# Patient Record
Sex: Female | Born: 1985 | Race: Black or African American | Hispanic: No | Marital: Single | Smoking: Never smoker
Health system: Southern US, Community
[De-identification: ages and names within clinical notes are randomized; demographics above are authoritative.]

## PROBLEM LIST (undated history)

## (undated) ENCOUNTER — Inpatient Hospital Stay (HOSPITAL_COMMUNITY): Payer: Self-pay

## (undated) DIAGNOSIS — K5900 Constipation, unspecified: Secondary | ICD-10-CM

## (undated) DIAGNOSIS — K529 Noninfective gastroenteritis and colitis, unspecified: Secondary | ICD-10-CM

## (undated) DIAGNOSIS — Z789 Other specified health status: Secondary | ICD-10-CM

## (undated) DIAGNOSIS — D649 Anemia, unspecified: Secondary | ICD-10-CM

## (undated) HISTORY — PX: NO PAST SURGERIES: SHX2092

## (undated) HISTORY — PX: DILATION AND CURETTAGE OF UTERUS: SHX78

---
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
## 2008-02-08 ENCOUNTER — Emergency Department (HOSPITAL_BASED_OUTPATIENT_CLINIC_OR_DEPARTMENT_OTHER): Admission: EM | Admit: 2008-02-08 | Discharge: 2008-02-08 | Payer: Self-pay | Admitting: Emergency Medicine

## 2008-04-05 ENCOUNTER — Inpatient Hospital Stay (HOSPITAL_COMMUNITY): Admission: AD | Admit: 2008-04-05 | Discharge: 2008-04-05 | Payer: Self-pay | Admitting: Obstetrics and Gynecology

## 2008-04-05 IMAGING — US US OB COMP LESS 14 WK
1 series · 14 of 28 positions shown · non-contrast
Comparison: None

CLINICAL DATA: Pregnancy Bleeding; ; QUANTITATIVE BETA HCG [DATE].

OBSTETRIC <14 WK US AND TRANSVAGINAL OB US
TECHNIQUE: Both transabdominal and transvaginal ultrasound
examinations were performed for complete evaluation of the
gestation as well as the maternal uterus, adnexal regions, and
pelvic cul-de-sac.

[Series 1: us ob comp less 14 wk · 0.24mm/px · 14 of 40 slices shown]
[im 2/40]
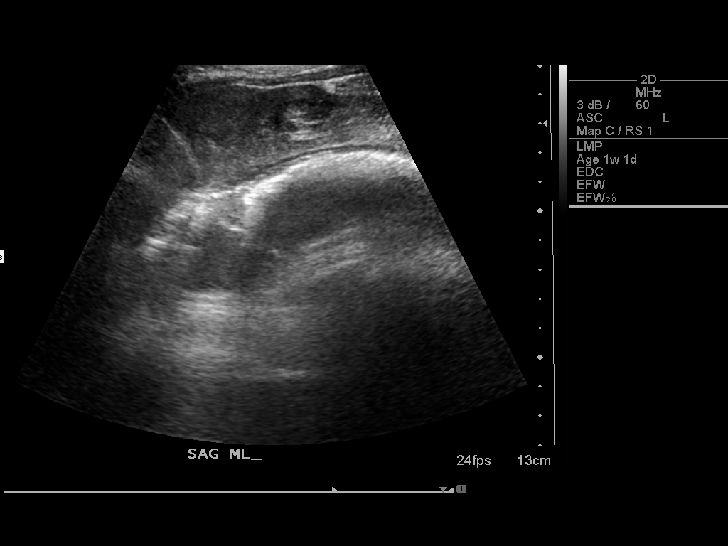
[im 5/40]
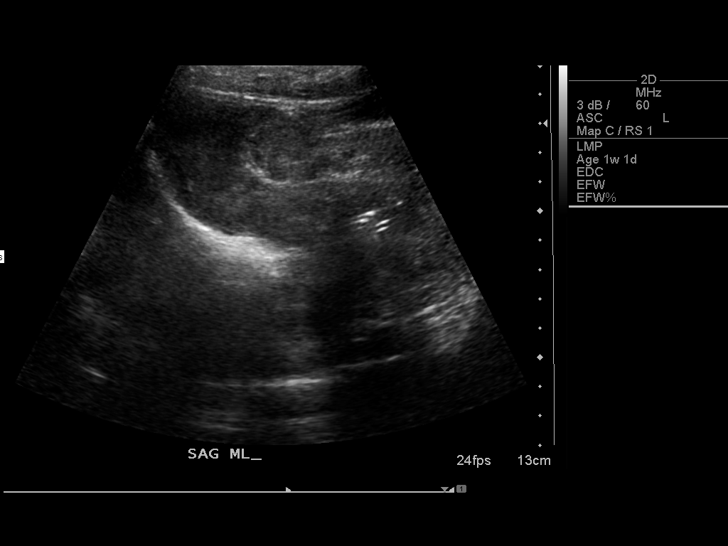
[im 8/40]
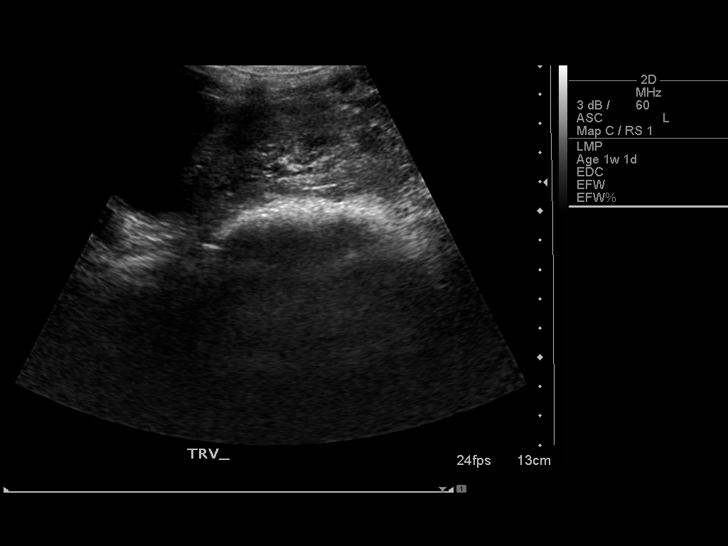
[im 11/40]
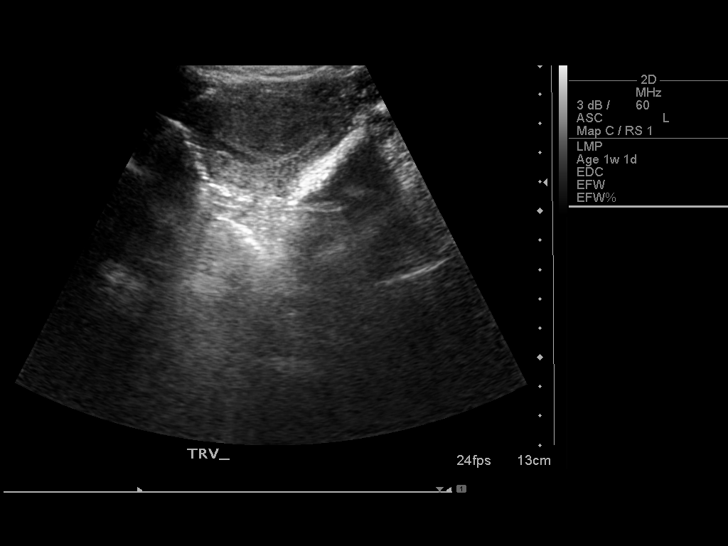
[im 14/40]
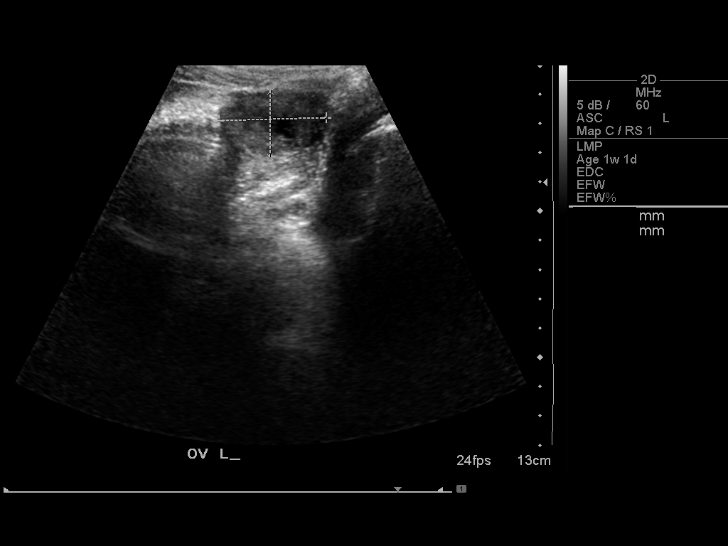
[im 16/40]
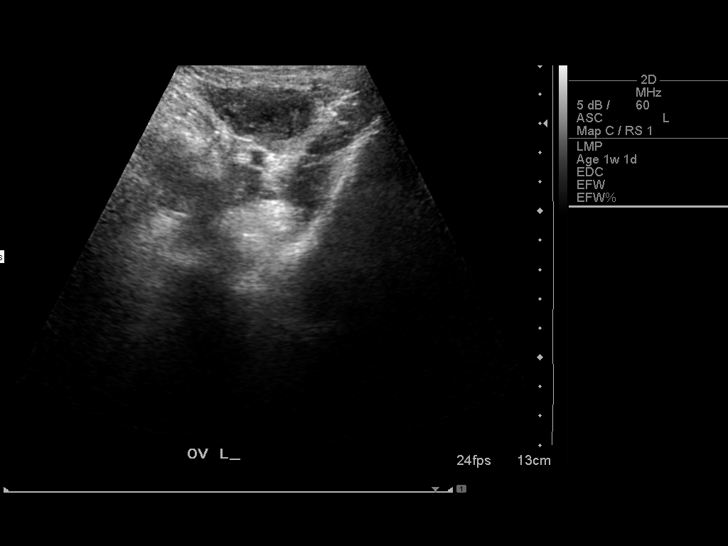
[im 19/40]
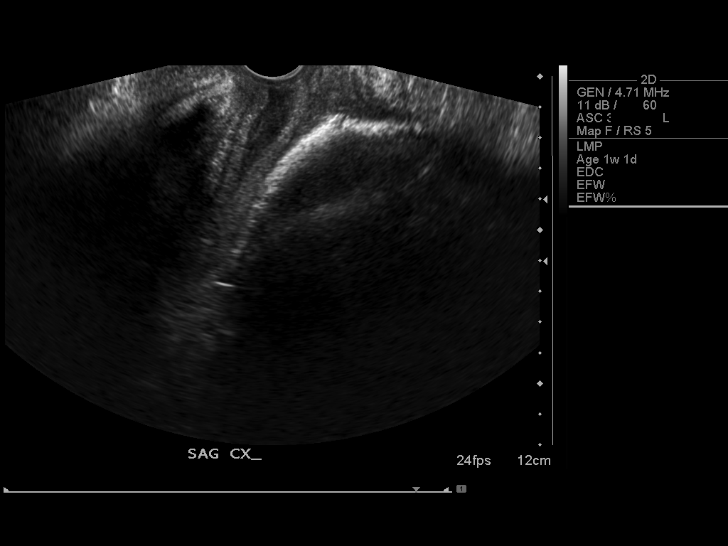
[im 22/40]
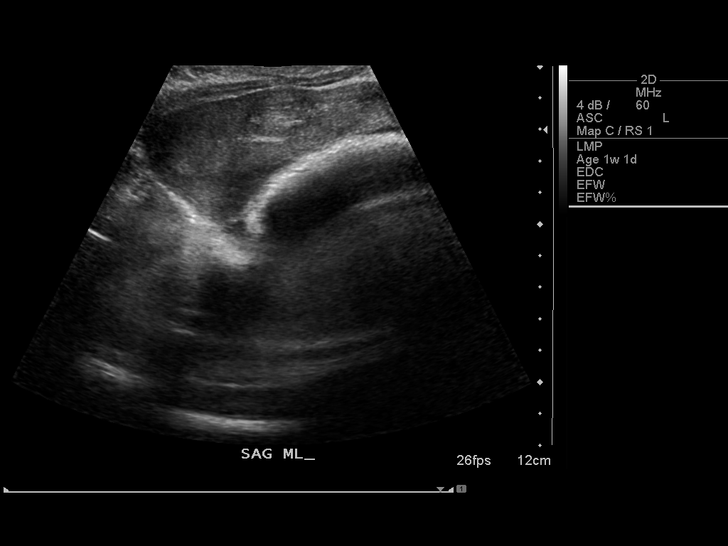
[im 25/40]
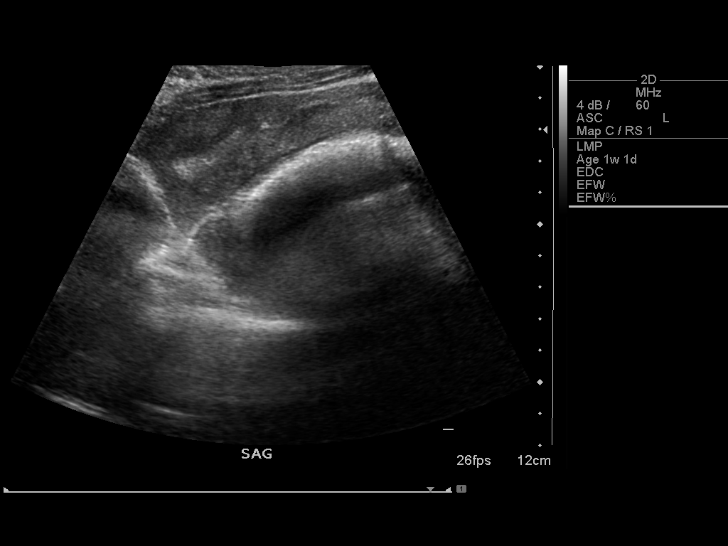
[im 28/40]
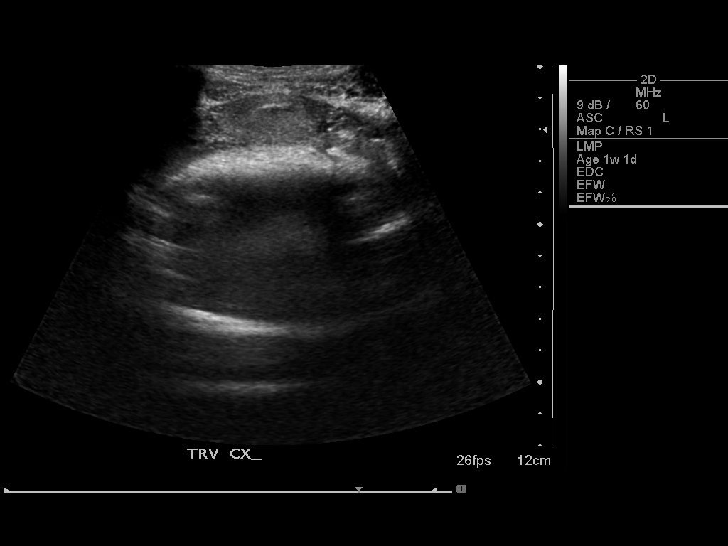
[im 31/40]
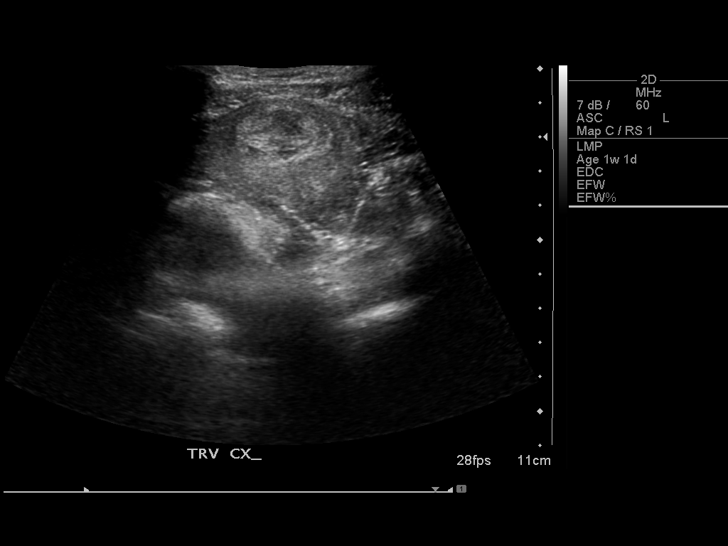
[im 34/40]
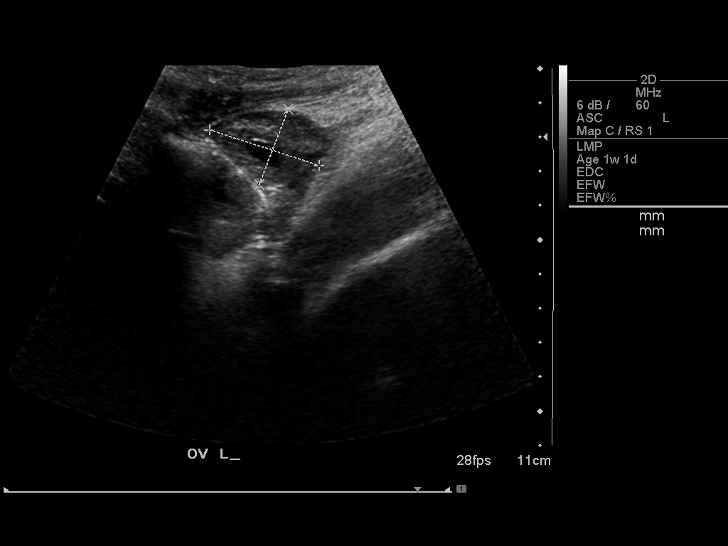
[im 37/40]
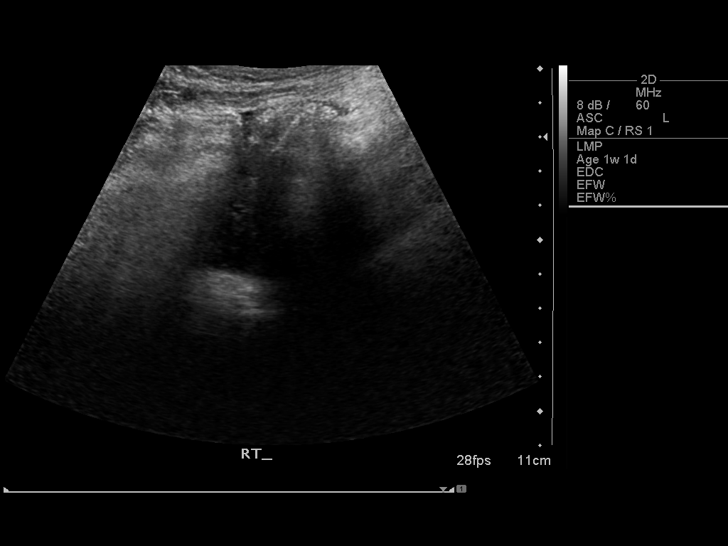
[im 40/40]
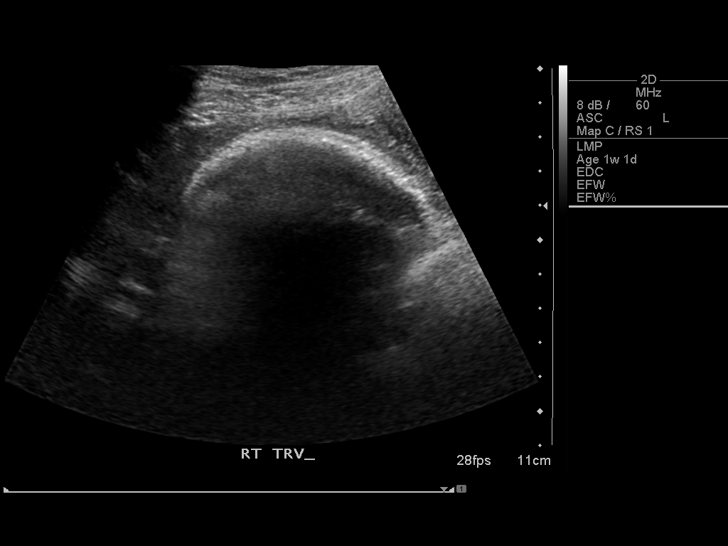

[14 of 28 positions shown; findings below may reference images not displayed]

FINDINGS: No intrauterine gestational sac identified.  There is a
rounded heterogeneous structure noted anteriorly within the uterus,
measuring 2.5 x 2.2 x 1.7 cm.

No free fluid in the pelvis.  Left ovary unremarkable.  Visualized
right adnexa unremarkable.  There is a large amount of stool and
gas which obscures the right adnexa partially.
IMPRESSION: No intrauterine gestation.  Given a quantitative beta HCG of
[DATE], this could be related to spontaneous abortion.  Cannot
completely exclude ectopic although this is much less likely given
the lack of right free fluid or adnexal mass.  Recommend
correlation with quantitative beta HCG levels.

Heterogeneous rounded structure in the anterior uterus, which could
represent submucosal fibroid or retained products of conception.

## 2008-04-11 ENCOUNTER — Inpatient Hospital Stay (HOSPITAL_COMMUNITY): Admission: AD | Admit: 2008-04-11 | Discharge: 2008-04-11 | Payer: Self-pay | Admitting: Obstetrics and Gynecology

## 2008-05-07 ENCOUNTER — Emergency Department (HOSPITAL_BASED_OUTPATIENT_CLINIC_OR_DEPARTMENT_OTHER): Admission: EM | Admit: 2008-05-07 | Discharge: 2008-05-07 | Payer: Self-pay | Admitting: Emergency Medicine

## 2008-07-10 ENCOUNTER — Emergency Department (HOSPITAL_BASED_OUTPATIENT_CLINIC_OR_DEPARTMENT_OTHER): Admission: EM | Admit: 2008-07-10 | Discharge: 2008-07-10 | Payer: Self-pay | Admitting: Emergency Medicine

## 2009-03-18 ENCOUNTER — Emergency Department (HOSPITAL_BASED_OUTPATIENT_CLINIC_OR_DEPARTMENT_OTHER): Admission: EM | Admit: 2009-03-18 | Discharge: 2009-03-18 | Payer: Self-pay | Admitting: Emergency Medicine

## 2009-04-10 ENCOUNTER — Emergency Department (HOSPITAL_BASED_OUTPATIENT_CLINIC_OR_DEPARTMENT_OTHER): Admission: EM | Admit: 2009-04-10 | Discharge: 2009-04-10 | Payer: Self-pay | Admitting: Emergency Medicine

## 2009-04-11 ENCOUNTER — Emergency Department (HOSPITAL_BASED_OUTPATIENT_CLINIC_OR_DEPARTMENT_OTHER): Admission: EM | Admit: 2009-04-11 | Discharge: 2009-04-11 | Payer: Self-pay | Admitting: Emergency Medicine

## 2010-11-14 LAB — WOUND CULTURE

## 2010-11-15 LAB — BASIC METABOLIC PANEL
BUN: 11 mg/dL (ref 6–23)
Calcium: 10 mg/dL (ref 8.4–10.5)
Chloride: 106 mEq/L (ref 96–112)
Creatinine, Ser: 0.8 mg/dL (ref 0.4–1.2)
GFR calc Af Amer: >60 mL/min (ref >60)
Potassium: 4.6 mEq/L (ref 3.5–5.1)

## 2010-11-15 LAB — URINALYSIS, ROUTINE W REFLEX MICROSCOPIC
Glucose, UA: NEGATIVE mg/dL
Nitrite: POSITIVE — AB
pH: 5.5 (ref 5.0–8.0)

## 2010-11-15 LAB — URINE MICROSCOPIC-ADD ON

## 2010-11-15 LAB — PREGNANCY, URINE: Preg Test, Ur: NEGATIVE

## 2010-12-26 NOTE — Op Note (Signed)
Maria Harvey, Maria Harvey            ACCOUNT NO.:  [id number]   MEDICAL RECORD NO.:  [id number]          PATIENT TYPE:  AMB   LOCATION:  SDC                           FACILITY:  [facility]   PHYSICIAN:  Gerrit Friends. Aldona Bar, M.D.   DATE OF BIRTH:  07-07-1986   DATE OF PROCEDURE:  12/30/2005  DATE OF DISCHARGE:                                 OPERATIVE REPORT   PREOPERATIVE DIAGNOSES:  First trimester pregnancy loss - missed abortion -  blood type O+.   POSTOPERATIVE DIAGNOSES:  First trimester pregnancy loss - missed abortion -  blood type O+.  Pathology pending.   PROCEDURE:  Examination under anesthesia, suction dilatation and curettage  and ultrasound.   HISTORY:  This 25 year old gravida 3, para 0 (2 previous elective  terminations) presented to the office several weeks ago for evaluation as a  newly pregnant patient.  It was discovered that her size did not match her  dates, and indeed, the diagnosis of a missed AB was eventually made.  The  patient's anatomy is quite unusual - it appears as if her rectum is filled  with a large amount of stool, and her cervix is very deep and to the left  and the uterus appears to be posterior.  With much difficulty, she was  examined in the office.   The patient was taken to the operating room, and after satisfactory  induction of intravenous conscious sedation, she was prepped and draped in  the usual fashion, having been placed in the modified lithotomy position in  the short Allen stirrups.  The bladder was drained of clear urine with red  rubber catheter in an in-and-out fashion, and thereafter, after the patient  was prepped and draped, the patient was examined.  Again, it appeared that  the posterior vagina was pushed anteriorly by what appeared to be a large  amount of stool in the rectum.  The cervix was very difficult to feel and  was very deep in the vagina, slightly deviated to the left side of the apex  of vagina.  It was not possible to  feel the uterus.  On previous ultrasound,  it appeared that the uterus did tend to go posterior and to the left.   At this time, a low Grave's speculum was put in upside down, so that  visualization of the cervix could be carried out.  The patient was well  sedated with intravenous conscious sedation, and so, it was possible to put  a single-tooth tenaculum on the cervix and dilate the cervix to a #25 Pratt  dilator without difficulty.  Thereafter, using a #8 curved suction curette,  what was felt to be the cavity was entered and evacuated appropriately.  Because of the depth to which a curette would have to be placed, curettage  was not done.  The cavity was thoroughly, gently and hopefully effectively  evacuated of all products of conception.  At this time, the ultrasound  machine was obtained from maternity admissions, and using the vaginal probe  and the abdominal probe, a uterus with retained products was not visualized.   In  again, examination was carried out, and this time, a rectovaginal exam  was done.  Again, it was noted the rectum was markedly dilated by a large  amount of stool, and this was the mass that was felt posterior to the  vagina.  The cervix was very deep and at the left of the midline at the apex  of the vagina, and nothing consistent with the uterus was really felt  thereafter.  Bleeding was minimal.  Examination of what was removed was  consistent with products of conception, and it was assumed at this time that  the procedure was complete.  The patient was not bleeding, and at this  point, the procedure was terminated, and the patient was sent to the  recovery room in satisfactory condition having tolerated the procedure well.   The patient will be discharged to home with a prescription for doxycycline  to use 100 mg twice daily for 5 days and Anaprox DS 1 every 8 hours as  needed for cramping and pain.  She will be seen in the office in  approximately two  weeks' time or as needed.  It may be necessary at some  point in the near future to have this patient evaluated to actually  determine her lower GI tract and pelvic anatomy.  Condition on arrival in  the recovery room satisfactory.      Gerrit Friends. Aldona Bar, M.D.  Electronically Signed     RMW/MEDQ  D:  12/30/2005  T:  12/30/2005  Job:  [id number]

## 2010-12-26 NOTE — Discharge Summary (Signed)
Maria Harvey, Maria Harvey            ACCOUNT NO.:  [id number]   MEDICAL RECORD NO.:  [id number]          PATIENT TYPE:  INP   LOCATION:  9306                          FACILITY:  [facility]   PHYSICIAN:  Randye Lobo, M.D.   DATE OF BIRTH:  [birth year]   DATE OF ADMISSION:  01/07/2006  DATE OF DISCHARGE:  01/11/2006                                 DISCHARGE SUMMARY   ADMISSION DIAGNOSES:  1.  Status post dilation and evacuation on Dec 30, 2005, for missed      abortion.  2.  Colitis.   DISCHARGE DIAGNOSES:  1.  Status post dilation and evacuation for missed abortion on Dec 30, 2005.  2.  Colitis, resolving.  3.  Possible low-grade endometritis/pelvic inflammatory disease.   ADMISSION HISTORY AND PHYSICAL EXAMINATION:  The patient is a 25 year old,  gravida 3, para 0-0-3-0, African American female, status post dilation and  evacuation for missed abortion on Dec 30, 2005, who presented to the [facility] on January 08, 2006, with lower abdominal pain localizing to the right  lower quadrant along with nausea, vomiting, diarrhea, and low-grade fever.  The patient had her T&E performed on Dec 30, 2005, which was technically  challenging due to a dilated rectum filled with firm stool. The dilation and  evacuation was performed with ultrasound guidance and there were no  complications. The pathology report documented tissue from the endometrial  which was decidua which chorionic villi and implantation site consistent  with intrauterine pregnancy. The patient was having some mild vaginal  bleeding. The patient was placed on doxycycline antibiotics postoperatively  for infection prophylaxis.   The patient's admission physical exam was consistent with a temperature of  98.8 degrees Fahrenheit, pulse 97, and blood pressure 129/82.  The abdomen  was generally tender with evidence of guarding. On pelvic exam there was no  evidence of any pus coming from the cervical os. An aerobic culture was  taken.  Gonorrhea and chlamydia cultures were also obtained. There was  evidence of cervical motion tenderness and no masses were appreciated. There  was tenderness in both the right and left lower quadrant.   The patient's admission documented a white count of 28,000 with 88%  neutrophils, 5% lymphocytes, 8% monocytes, and 0% eosinophils and basophils.  The patient's hemoglobin was 13.6. Her complete metabolic profile was  unremarkable except for a glucose level of 154.   The patient was given a working diagnosis of possible pelvic inflammatory  disease and C. difficile colitis.  The patient was started on IV  ciprofloxacin and IV doxycycline in addition to Flagyl. A CT scan of the  abdomen and pelvis was performed documenting markedly edematous and thick  walled rectus sigmoid colon consistent with colitis or severe inflammatory  bowel disease. There was no evidence of free fluid or free air or adenopathy  of the abdomen. The liver, spleen, pancreas, adrenals, kidneys, uterus, and  adnexa were unremarkable.   The patient was treated with IV fluids and bowel rest along with her  antibiotics. The patient remained afebrile throughout her hospital stay and  had normal  vital signs. Her abdominal exam became normal by the time of her  discharge and she was able to tolerate a regular diet. The patient did note  a little bit of possible rectal bleeding with a solid bowel movement on the  day prior to her discharge. It was unclear if this was related to her  vaginal bleeding following her dilation and evacuation.   The patient's uterine culture returned demonstrating moderate gram-positive  rods with ultimately multiple organisms and none predominant. Her stool  culture demonstrated normal flora. There was no evidence of Salmonella,  Shigella, Campylobacter, or Yersinia. Her Clostridium difficile culture was  negative. The patient's CBC on June 3rd documented a white blood cell count  of 10.4  and her hemoglobin was 9.5.   The patient was found to be in good condition and ready for discharge on  hospital day #4.   INSTRUCTIONS AT DISCHARGE:  1.  Discharge to home.  2.  The patient will take ciprofloxacin 500 mg p.o. b.i.d. for the following      seven days, along with doxycycline 100 mg p.o. b.i.d. times seven days.  3.  The patient will follow a regular diet.  4.  The patient will follow up with Dr. Aldona Bar in the office for a      postoperative check in about 7-10 days.  5.  The patient will call if she experiences any problems with fever,      increased abdominal pain, recurrent diarrhea, any persistent rectal      bleeding, or any other concerns.  6.  Consideration will be given for a repeat CT scan of the abdomen and      pelvis as well as referral to a gastroenterologist for evaluation of      colitis as an outpatient.      Randye Lobo, M.D.  Electronically Signed     BES/MEDQ  D:  01/11/2006  T:  01/11/2006  Job:  [id number]   cc:   Gerrit Friends. Aldona Bar, M.D.  Fax: [phone]

## 2011-05-11 LAB — URINALYSIS, ROUTINE W REFLEX MICROSCOPIC
Hgb urine dipstick: NEGATIVE
Ketones, ur: NEGATIVE
pH: 6

## 2011-05-11 LAB — URINE MICROSCOPIC-ADD ON

## 2011-05-15 LAB — URINE MICROSCOPIC-ADD ON

## 2011-05-15 LAB — URINALYSIS, ROUTINE W REFLEX MICROSCOPIC
Hgb urine dipstick: NEGATIVE
Ketones, ur: 15 mg/dL — AB
Leukocytes, UA: NEGATIVE
Specific Gravity, Urine: 1.029 (ref 1.005–1.030)

## 2011-05-15 LAB — GC/CHLAMYDIA PROBE AMP, GENITAL: Chlamydia, DNA Probe: NEGATIVE

## 2011-05-15 LAB — PREGNANCY, URINE: Preg Test, Ur: NEGATIVE

## 2011-05-15 LAB — WET PREP, GENITAL: Trich, Wet Prep: NONE SEEN

## 2014-06-03 ENCOUNTER — Encounter (HOSPITAL_BASED_OUTPATIENT_CLINIC_OR_DEPARTMENT_OTHER): Payer: Self-pay | Admitting: Emergency Medicine

## 2014-06-03 ENCOUNTER — Emergency Department (HOSPITAL_BASED_OUTPATIENT_CLINIC_OR_DEPARTMENT_OTHER)
Admission: EM | Admit: 2014-06-03 | Discharge: 2014-06-04 | Disposition: A | Discharge status: Home / Self Care | Payer: Self-pay | Attending: Emergency Medicine | Admitting: Emergency Medicine

## 2014-06-03 DIAGNOSIS — O23511 Infections of cervix in pregnancy, first trimester: Secondary | ICD-10-CM | POA: Insufficient documentation

## 2014-06-03 DIAGNOSIS — Z3A00 Weeks of gestation of pregnancy not specified: Secondary | ICD-10-CM | POA: Insufficient documentation

## 2014-06-03 DIAGNOSIS — Z3490 Encounter for supervision of normal pregnancy, unspecified, unspecified trimester: Secondary | ICD-10-CM

## 2014-06-03 DIAGNOSIS — N390 Urinary tract infection, site not specified: Secondary | ICD-10-CM

## 2014-06-03 DIAGNOSIS — A599 Trichomoniasis, unspecified: Secondary | ICD-10-CM

## 2014-06-03 DIAGNOSIS — N72 Inflammatory disease of cervix uteri: Secondary | ICD-10-CM

## 2014-06-03 DIAGNOSIS — A5901 Trichomonal vulvovaginitis: Secondary | ICD-10-CM | POA: Insufficient documentation

## 2014-06-03 DIAGNOSIS — O2341 Unspecified infection of urinary tract in pregnancy, first trimester: Secondary | ICD-10-CM | POA: Insufficient documentation

## 2014-06-03 LAB — URINALYSIS, ROUTINE W REFLEX MICROSCOPIC
Glucose, UA: NEGATIVE mg/dL
Hgb urine dipstick: NEGATIVE
Ketones, ur: 15 mg/dL — AB
Nitrite: POSITIVE — AB
PH: 5.5 (ref 5.0–8.0)
PROTEIN: NEGATIVE mg/dL
Specific Gravity, Urine: 1.035 — ABNORMAL HIGH (ref 1.005–1.030)
UROBILINOGEN UA: 1 mg/dL (ref 0.0–1.0)

## 2014-06-03 LAB — URINE MICROSCOPIC-ADD ON

## 2014-06-03 LAB — WET PREP, GENITAL: YEAST WET PREP: NONE SEEN

## 2014-06-03 LAB — PREGNANCY, URINE: Preg Test, Ur: POSITIVE — AB

## 2014-06-03 NOTE — ED Notes (Signed)
Pt reports n/v/d since she woke up this am.  Reports 3 episodes of vomiting and 4 episodes of diarrhea.

## 2014-06-03 NOTE — ED Provider Notes (Signed)
CSN: [id number]     Arrival date & time 06/03/14  2127 History  This chart was scribed for [name], MD by Roxy Cedarhandni Bhalodia, ED Scribe. This patient was seen in room MH10/MH10 and the patient's care was started at 11:19 PM.   Chief Complaint  Patient presents with  . Emesis   Patient is a 28 y.o. female presenting with vomiting. The history is provided by the patient. No language interpreter was used.  Emesis Severity:  Moderate Duration:  1 day Timing:  Intermittent Number of daily episodes:  3 Quality:  Stomach contents Progression:  Unchanged Chronicity:  New Relieved by:  Nothing Worsened by:  Nothing tried Ineffective treatments:  None tried Associated symptoms: no fever   Risk factors: no sick contacts    HPI Comments: Maria Harvey is a 28 y.o. female who presents to the Emergency Department complaining of emesis that began this morning.  History reviewed. No pertinent past medical history. History reviewed. No pertinent past surgical history. No family history on file. History  Substance Use Topics  . Smoking status: Never Smoker   . Smokeless tobacco: Not on file  . Alcohol Use: No   OB History   Grav Para Term Preterm Abortions TAB SAB Ect Mult Living                 Review of Systems  Gastrointestinal: Positive for vomiting.  All other systems reviewed and are negative.  Allergies  Review of patient's allergies indicates no known allergies.  Home Medications   Prior to Admission medications   Not on File   Triage Vitals: BP 122/76  Pulse 84  Temp(Src) 98.3 F (36.8 C) (Oral)  Resp 16  Ht 5\' 11"  (1.803 m)  Wt 153 lb (69.4 kg)  BMI 21.35 kg/m2  SpO2 100%  Physical Exam  Nursing note and vitals reviewed. Constitutional: She is oriented to person, place, and time. She appears well-developed and well-nourished.  HENT:  Head: Normocephalic and atraumatic.  Right Ear: External ear normal.  Left Ear: External ear normal.   Mouth/Throat: No oropharyngeal exudate.  Eyes: Conjunctivae are normal. Pupils are equal, round, and reactive to light. Right eye exhibits no discharge. Left eye exhibits no discharge.  Neck: Normal range of motion. No tracheal deviation present.  Cardiovascular: Normal rate, regular rhythm and normal heart sounds.   Pulmonary/Chest: Effort normal and breath sounds normal. No respiratory distress.  Abdominal: Soft. Bowel sounds are normal. She exhibits no distension and no mass. There is no tenderness. There is no rebound and no guarding.  Genitourinary: Vaginal discharge found.  Chaperone present  Musculoskeletal: Normal range of motion. She exhibits no edema and no tenderness.  Neurological: She is alert and oriented to person, place, and time. Coordination normal.  Skin: Skin is warm and dry. No rash noted. She is not diaphoretic. No erythema.  Psychiatric: She has a normal mood and affect.   ED Course  Procedures (including critical care time)  DIAGNOSTIC STUDIES: Oxygen Saturation is 100% on RA, normal by my interpretation.    COORDINATION OF CARE: 11:25 PM- Discussed positive pregnancy test results. Told patient to avoid sexual activity for the next 7 days. Pt advised of plan for treatment and pt agrees.  Labs Review Labs Reviewed  URINALYSIS, ROUTINE W REFLEX MICROSCOPIC - Abnormal; Notable for the following:    APPearance TURBID (*)    Specific Gravity, Urine 1.035 (*)    Bilirubin Urine SMALL (*)    Ketones, ur  15 (*)    Nitrite POSITIVE (*)    Leukocytes, UA LARGE (*)    All other components within normal limits  PREGNANCY, URINE - Abnormal; Notable for the following:    Preg Test, Ur POSITIVE (*)    All other components within normal limits  URINE MICROSCOPIC-ADD ON - Abnormal; Notable for the following:    Squamous Epithelial / LPF MANY (*)    Bacteria, UA MANY (*)    All other components within normal limits   Imaging Review No results found.   EKG  Interpretation None     MDM   Final diagnoses:  None    1. Pregnancy 2. uti 3. Trichomoniasis Medications  metroNIDAZOLE (FLAGYL) tablet 2,000 mg (2,000 mg Oral Given 06/04/14 0026)  cefTRIAXone (ROCEPHIN) injection 250 mg (250 mg Intramuscular Given 06/04/14 0026)  azithromycin (ZITHROMAX) powder 1 g (1 g Oral Given 06/04/14 0026)   Treated for STIs.  No sexual activity until 7 days after all partners treated.  Follow up with gynecology for pregnancy  I personally performed the services described in this documentation, which was scribed in my presence. The recorded information has been reviewed and is accurate.  Vyncent Overby Smitty CordsK Dusty Wagoner-Rasch, MD 06/04/14 0430

## 2014-06-04 ENCOUNTER — Encounter (HOSPITAL_BASED_OUTPATIENT_CLINIC_OR_DEPARTMENT_OTHER): Payer: Self-pay | Admitting: Emergency Medicine

## 2014-06-04 LAB — GC/CHLAMYDIA PROBE AMP
CT PROBE, AMP APTIMA: POSITIVE — AB
GC Probe RNA: NEGATIVE

## 2014-06-04 MED ORDER — CEFTRIAXONE SODIUM 250 MG IJ SOLR
250.0000 mg | Freq: Once | INTRAMUSCULAR | Status: AC
  Administered 2014-06-04: 250 mg via INTRAMUSCULAR
  Filled 2014-06-04: qty 250

## 2014-06-04 MED ORDER — NITROFURANTOIN MONOHYD MACRO 100 MG PO CAPS: 100.0000 mg | ORAL_CAPSULE | Freq: Two times a day (BID) | ORAL | Status: DC

## 2014-06-04 MED ORDER — METRONIDAZOLE 500 MG PO TABS
2000.0000 mg | ORAL_TABLET | Freq: Once | ORAL | Status: AC
  Administered 2014-06-04: 2000 mg via ORAL
  Filled 2014-06-04: qty 4

## 2014-06-04 MED ORDER — AZITHROMYCIN 1 G PO PACK
1.0000 g | PACK | Freq: Once | ORAL | Status: AC
  Administered 2014-06-04: 1 g via ORAL
  Filled 2014-06-04: qty 1

## 2014-06-04 NOTE — Discharge Instructions (Signed)
°Emergency Department Resource Guide °1) Find a Doctor and Pay Out of Pocket °Although you won't have to find out who is covered by your insurance plan, it is a good idea to ask around and get recommendations. You will then need to call the office and see if the doctor you have chosen will accept you as a new patient and what types of options they offer for patients who are self-pay. Some doctors offer discounts or will set up payment plans for their patients who do not have insurance, but you will need to ask so you aren't surprised when you get to your appointment. ° °2) Contact Your Local Health Department °Not all health departments have doctors that can see patients for sick visits, but many do, so it is worth a call to see if yours does. If you don't know where your local health department is, you can check in your phone book. The CDC also has a tool to help you locate your state's health department, and many state websites also have listings of all of their local health departments. ° °3) Find a Walk-in Clinic °If your illness is not likely to be very severe or complicated, you may want to try a walk in clinic. These are popping up all over the country in pharmacies, drugstores, and shopping centers. They're usually staffed by nurse practitioners or physician assistants that have been trained to treat common illnesses and complaints. They're usually fairly quick and inexpensive. However, if you have serious medical issues or chronic medical problems, these are probably not your best option. ° °No Primary Care Doctor: °- Call [facility] at  832-8000 - they can help you locate a primary care doctor that  accepts your insurance, provides certain services, etc. °- Physician Referral Service- 1-800-533-3463 ° °Chronic Pain Problems: °Organization         Address  Phone   Notes  °[facility]  (336) 297-2271 Patients need to be referred by their primary care doctor.  ° °Medication  Assistance: °Organization         Address  Phone   Notes  °[place] [facility] [address] °[place], [place] [address] (336) 641-8030 --Must be a resident of [place] °-- Must have NO insurance coverage whatsoever (no Medicaid/ Medicare, etc.) °-- The pt. MUST have a primary care doctor that directs their care regularly and follows them in the community °  °[facility]  (866) 331-1348   °[employer]  (888) 892-1162   ° °Agencies that provide inexpensive medical care: °Organization         Address  Phone   Notes  °[facility]  (336) 832-8035   °[facility]    (336) 832-7272   °[facility] [address] °[place], [place] [address] (336) 832-4777   °[facility] [address], °[place] (336) 271-4999   °[facility]    (336) 373-0678   °[facility]    (336) 272-1050   °[facility] ° [address], [place] Phone:  (336) 832-4444, Fax:  (336) 832-4440 Hours of Operation:  9 am - 6 pm, M-F.  Also accepts Medicaid/Medicare and self-pay.  °[facility] ° [address], [place] Phone: (336) 832-3150, Fax: (336) [phone]. Hours of Operation:  8:30 am - 5:30 pm, M-F.  Also accepts Medicaid and self-pay.  °[facility] [place] [address], [place] Phone: (336) 878-6027   °[facility] [address], [place], [place] (336)723-1848, Ext. 123 Mondays & Thursdays: 7-9 AM.  First 15 patients are seen on a first come, first serve basis. °  ° °Medicaid-accepting [place] Providers: ° °Organization         Address  Phone   Notes  °[facility] [address], [place] (336) 641-2100 Also accepts self-pay patients.  °[facility] [address], [place] ° (336) 856-9996   °[facility] [address], [place]  (336) 288-8857   °[facility] [address] [place] [address], [place] (336) 299-7000   °Veita Bland [address], [place]  ° (336) 373-1557 Only accepts [place] Access Medicaid patients after they have their name applied to their card.  ° °Self-Pay (no insurance) in [place]: ° °Organization         Address  Phone   Notes  °Sickle Cell Patients, [facility] [address], [place] (336) 832-1970   °[facility] [address], [place] (336) 832-4400   °[facility] ° [address], [place] (336) 992-4800   °[facility]/Dr. Osei-Bonsu ° [address] [place] [address], [place] or [address], [place] (336) [phone] Phone number for both [place] and [place] locations is the same.  °[facility] [address], [place] (336) 299-0000   °[facility] [address] [place] [address], [place] or [address] (336) 852-7530 °(336) 878-2260   °[facility] [address], [place] (336) 350-1642, phone; (336) 294-5005, fax Sees patients 1st and 3rd Saturday of every month.  Must not qualify for public or private insurance (i.e. Medicaid, Medicare, [place] Health Choice, Veterans' Benefits) • Household income should be no more than 200% of the poverty level •The clinic cannot treat you if you are pregnant or think you are pregnant • Sexually transmitted diseases are not treated at the clinic.  ° ° °Dental Care: °Organization         Address  Phone  Notes  °[place] [facility] [address], [place] (336) 641-6152 Accepts children up to age 21 who are enrolled in Medicaid or [place] Health Choice; pregnant women with a Medicaid card; and children who have applied for Medicaid or [place] Health Choice, but were declined, whose parents can pay a reduced fee at time of service.  °[place]  [facility] [place]  [address], [place] (336) 641-7733 Accepts children up to age 21 who are enrolled in Medicaid or [place] Health Choice; pregnant women with a Medicaid card; and children who have applied for Medicaid or [place] Health Choice, but were declined, whose parents can pay a reduced fee at time of service.  °[facility] ° [address], [place] (336) 641-4533 Patients are seen by appointment only. Walk-ins are not accepted. [facility] will see patients 18 years of age and older. °Monday - Tuesday (8am-5pm) °Most Wednesdays (8:30-5pm) °$30 per visit, cash only  °[facility] ° [address], [place] (336) 641-4533 Patients are seen by appointment only. Walk-ins are not accepted. [facility] will see patients 18 years of age and older. °One   Wednesday Evening (Monthly: Volunteer Based).  $30 per visit, cash only  °[facility]  (919) 537-3737 for adults; Children under age 4, call Graduate Pediatric Dentistry at ([phone]) [phone]. Children aged 4-14, please call (919) [phone] to request a pediatric application. ° Dental services are provided in all areas of dental care including fillings, crowns and bridges, complete and partial dentures, implants, gum treatment, root canals, and extractions. Preventive care is also provided. Treatment is provided to both adults and children. °Patients are selected via a lottery and there is often a waiting list. °  °[facility] [address], °[place] ° (336) 763-8833 [email or web address].[email or web address].[email or web address] °  °[facility] [address], [place], [place] (336)723-1848, Ext. 123 Second and Fourth Thursday of each month, opens at 6:30 AM; Clinic ends at 9 AM.  Patients are seen on a first-come first-served basis, and a limited number are seen during each clinic.  ° °[facility] ° [address], [place], [place] (336) 723-7904    Eligibility Requirements °You must have lived in [place], [place], or [place] counties for at least the last three months. °  You cannot be eligible for state or federal sponsored healthcare insurance, including Veterans Administration, Medicaid, or Medicare. °  You generally cannot be eligible for healthcare insurance through your employer.  °  How to apply: °Eligibility screenings are held every Tuesday and Wednesday afternoon from 1:00 pm until 4:00 pm. You do not need an appointment for the interview!  °[facility] [address], [place], [place] 336-631-2330   °[facility]  336-342-8273   °[facility]  336-703-3100   °[facility]  336-570-6415   ° °Behavioral Health Resources in the Community: °Intensive Outpatient Programs °Organization         Address  Phone  Notes  °[facility] [address], [place], [place] 336-878-6098   °[facility] [address], [place], [place] 336-832-9800   °[facility] [address], [place], [place] ° 336-882-2125   °[facility] [address],  °[place], [place] 1-800-853-5163 or 336-641-4981   °Substance Abuse Resources °Organization         Address  Phone  Notes  °[facility]  336-882-2125   °[facility]  336-784-9470   °[facility]  336-285-9073   °[facility]  336-845-3988   °Residential & Outpatient Substance Abuse Program  1-800-659-3381   °Psychological Services °Organization         Address  Phone  Notes  °[facility]  336- 832-9600   °[facility]  336- 378-7881   °[facility] [address], [place] 1-800-853-5163 or 336-641-4981   ° °Mobile Crisis Teams °Organization         Address  Phone  Notes  °[facility], Mobile Crisis Care Unit  1-877-626-1772   °Assertive °[facility] ° [address]  [place], [place] 336-834-9664   °Sharon DeEsch [address] °[place] 336-554-5454   ° °Self-Help/Support Groups °Organization         Address  Phone             Notes  °[facility] - variety of support groups  336- 373-1402 Call for more information  °Narcotics Anonymous (NA), [facility] [address], °[place]  2 meetings at this location  ° °  Residential Treatment Programs °Organization         Address  Phone  Notes  °[facility] [address],    °[place]  1-866-801-8205   °[facility] ° [address], [place], [place] 704-293-8524   °[facility] [address], [place] 336-845-3988 Admissions: 8am-3pm M-F  °[facility] [address]    °[place], [place] 336-841-1104   °[facility] [address], [place], [place] 336-379-7146   °[facility] [address]  °[place], [place] 336-285-9073   °[facility] - Intensive Outpatient [address], [place], [place] 336-852-3033   °[facility] [address]  °[place], [place] 1-877-615-2722 or 336-784-9470   °[facility] [address] [place], [place] 336-227-7417 Accepts Medicaid  °[facility] [address]  °[place] 1-800-659-3381 Substance Abuse/Addiction Treatment  ° °[place] Behavioral Health Resources °Organization         Address  Phone  Notes  °[facility]  (888) 581-9988   °Julie Brannon, PhD [address] [place], [place]   (336) 349-5553 or (336) 951-0000   °[facility]   [address] °[place], [place] (336) 349-4454   °[facility] [address], [place], [place] (336) 342-8316 Insurance/Medicaid/sponsorship through [facility]  °[facility] [address]                                    [place], [place] (336) 342-8316 Therapy/tele-psych/case    °[facility] [address]  ° , [place] (336) 349-2233    °Dr. Arfeen  (336) 349-4544   °[facility] [place]  [employer] [place] [facility] 1) [address],  °2) [address], [place] °3)  [address], [place] (336) 349-3220 °(336) 342-7768 ° °(336) 342-8140   °[place] [facility] (336) 342-1394 or (336) 342-3537 (After Hours)    ° ° °

## 2014-06-05 ENCOUNTER — Telehealth (HOSPITAL_COMMUNITY): Payer: Self-pay

## 2014-06-06 ENCOUNTER — Telehealth (HOSPITAL_BASED_OUTPATIENT_CLINIC_OR_DEPARTMENT_OTHER): Payer: Self-pay | Admitting: Emergency Medicine

## 2014-06-08 ENCOUNTER — Telehealth (HOSPITAL_COMMUNITY): Payer: Self-pay

## 2014-06-08 NOTE — ED Notes (Signed)
Positive for chlamydia, treated per protocol. Letter sent to address on record

## 2014-07-02 ENCOUNTER — Telehealth (HOSPITAL_BASED_OUTPATIENT_CLINIC_OR_DEPARTMENT_OTHER): Payer: Self-pay | Admitting: *Deleted

## 2014-07-10 ENCOUNTER — Emergency Department (HOSPITAL_BASED_OUTPATIENT_CLINIC_OR_DEPARTMENT_OTHER)
Admission: EM | Admit: 2014-07-10 | Discharge: 2014-07-10 | Disposition: A | Discharge status: Home / Self Care | Payer: Medicaid Other | Attending: Emergency Medicine | Admitting: Emergency Medicine

## 2014-07-10 ENCOUNTER — Encounter (HOSPITAL_BASED_OUTPATIENT_CLINIC_OR_DEPARTMENT_OTHER): Payer: Self-pay | Admitting: *Deleted

## 2014-07-10 DIAGNOSIS — Z3A09 9 weeks gestation of pregnancy: Secondary | ICD-10-CM | POA: Insufficient documentation

## 2014-07-10 DIAGNOSIS — N751 Abscess of Bartholin's gland: Secondary | ICD-10-CM

## 2014-07-10 DIAGNOSIS — N760 Acute vaginitis: Secondary | ICD-10-CM

## 2014-07-10 DIAGNOSIS — O23591 Infection of other part of genital tract in pregnancy, first trimester: Secondary | ICD-10-CM | POA: Insufficient documentation

## 2014-07-10 DIAGNOSIS — O2391 Unspecified genitourinary tract infection in pregnancy, first trimester: Secondary | ICD-10-CM | POA: Diagnosis not present

## 2014-07-10 DIAGNOSIS — B9689 Other specified bacterial agents as the cause of diseases classified elsewhere: Secondary | ICD-10-CM

## 2014-07-10 LAB — WET PREP, GENITAL: TRICH WET PREP: NONE SEEN

## 2014-07-10 MED ORDER — METRONIDAZOLE 500 MG PO TABS: 500.0000 mg | ORAL_TABLET | Freq: Two times a day (BID) | ORAL | Status: DC

## 2014-07-10 MED ORDER — LIDOCAINE-EPINEPHRINE 2 %-1:100000 IJ SOLN
20.0000 mL | Freq: Once | INTRAMUSCULAR | Status: AC
  Administered 2014-07-10: 20 mL via INTRADERMAL
  Filled 2014-07-10: qty 1

## 2014-07-10 MED ORDER — AZITHROMYCIN 250 MG PO TABS
1000.0000 mg | ORAL_TABLET | Freq: Once | ORAL | Status: AC
  Administered 2014-07-10: 1000 mg via ORAL
  Filled 2014-07-10: qty 4

## 2014-07-10 MED ORDER — CLINDAMYCIN HCL 300 MG PO CAPS: 300.0000 mg | ORAL_CAPSULE | Freq: Four times a day (QID) | ORAL | Status: DC

## 2014-07-10 MED ORDER — OXYCODONE-ACETAMINOPHEN 5-325 MG PO TABS
2.0000 | ORAL_TABLET | Freq: Once | ORAL | Status: AC
  Administered 2014-07-10: 2 via ORAL
  Filled 2014-07-10: qty 2

## 2014-07-10 MED ORDER — CEFTRIAXONE SODIUM 250 MG IJ SOLR
250.0000 mg | Freq: Once | INTRAMUSCULAR | Status: AC
  Administered 2014-07-10: 250 mg via INTRAMUSCULAR
  Filled 2014-07-10: qty 250

## 2014-07-10 NOTE — Discharge Instructions (Signed)
Abscess °An abscess is an infected area that contains a collection of pus and debris. It can occur in almost any part of the body. An abscess is also known as a furuncle or boil. °CAUSES  °An abscess occurs when tissue gets infected. This can occur from blockage of oil or sweat glands, infection of hair follicles, or a minor injury to the skin. As the body tries to fight the infection, pus collects in the area and creates pressure under the skin. This pressure causes pain. People with weakened immune systems have difficulty fighting infections and get certain abscesses more often.  °SYMPTOMS °Usually an abscess develops on the skin and becomes a painful mass that is red, warm, and tender. If the abscess forms under the skin, you may feel a moveable soft area under the skin. Some abscesses break open (rupture) on their own, but most will continue to get worse without care. The infection can spread deeper into the body and eventually into the bloodstream, causing you to feel ill.  °DIAGNOSIS  °Your caregiver will take your medical history and perform a physical exam. A sample of fluid may also be taken from the abscess to determine what is causing your infection. °TREATMENT  °Your caregiver may prescribe antibiotic medicines to fight the infection. However, taking antibiotics alone usually does not cure an abscess. Your caregiver may need to make a small cut (incision) in the abscess to drain the pus. In some cases, gauze is packed into the abscess to reduce pain and to continue draining the area. °HOME CARE INSTRUCTIONS  °· Only take over-the-counter or prescription medicines for pain, discomfort, or fever as directed by your caregiver. °· If you were prescribed antibiotics, take them as directed. Finish them even if you start to feel better. °· If gauze is used, follow your caregiver's directions for changing the gauze. °· To avoid spreading the infection: °¨ Keep your draining abscess covered with a  bandage. °¨ Wash your hands well. °¨ Do not share personal care items, towels, or whirlpools with others. °¨ Avoid skin contact with others. °· Keep your skin and clothes clean around the abscess. °· Keep all follow-up appointments as directed by your caregiver. °SEEK MEDICAL CARE IF:  °· You have increased pain, swelling, redness, fluid drainage, or bleeding. °· You have muscle aches, chills, or a general ill feeling. °· You have a fever. °MAKE SURE YOU:  °· Understand these instructions. °· Will watch your condition. °· Will get help right away if you are not doing well or get worse. °Document Released: 05/06/2005 Document Revised: 01/26/2012 Document Reviewed: 10/09/2011 °ExitCare® Patient Information ©2015 [employer]. This information is not intended to replace advice given to you by your health care provider. Make sure you discuss any questions you have with your health care provider. ° ° °Bacterial Vaginosis °Bacterial vaginosis is a vaginal infection that occurs when the normal balance of bacteria in the vagina is disrupted. It results from an overgrowth of certain bacteria. This is the most common vaginal infection in women of childbearing age. Treatment is important to prevent complications, especially in pregnant women, as it can cause a premature delivery. °CAUSES  °Bacterial vaginosis is caused by an increase in harmful bacteria that are normally present in smaller amounts in the vagina. Several different kinds of bacteria can cause bacterial vaginosis. However, the reason that the condition develops is not fully understood. °RISK FACTORS °Certain activities or behaviors can put you at an increased risk of developing bacterial vaginosis, including: °·   Having a new sex partner or multiple sex partners. °· Douching. °· Using an intrauterine device (IUD) for contraception. °Women do not get bacterial vaginosis from toilet seats, bedding, swimming pools, or contact with objects around them. °SIGNS AND  SYMPTOMS  °Some women with bacterial vaginosis have no signs or symptoms. Common symptoms include: °· Grey vaginal discharge. °· A fishlike odor with discharge, especially after sexual intercourse. °· Itching or burning of the vagina and vulva. °· Burning or pain with urination. °DIAGNOSIS  °Your health care provider will take a medical history and examine the vagina for signs of bacterial vaginosis. A sample of vaginal fluid may be taken. Your health care provider will look at this sample under a microscope to check for bacteria and abnormal cells. A vaginal pH test may also be done.  °TREATMENT  °Bacterial vaginosis may be treated with antibiotic medicines. These may be given in the form of a pill or a vaginal cream. A second round of antibiotics may be prescribed if the condition comes back after treatment.  °HOME CARE INSTRUCTIONS  °· Only take over-the-counter or prescription medicines as directed by your health care provider. °· If antibiotic medicine was prescribed, take it as directed. Make sure you finish it even if you start to feel better. °· Do not have sex until treatment is completed. °· Tell all sexual partners that you have a vaginal infection. They should see their health care provider and be treated if they have problems, such as a mild rash or itching. °· Practice safe sex by using condoms and only having one sex partner. °SEEK MEDICAL CARE IF:  °· Your symptoms are not improving after 3 days of treatment. °· You have increased discharge or pain. °· You have a fever. °MAKE SURE YOU:  °· Understand these instructions. °· Will watch your condition. °· Will get help right away if you are not doing well or get worse. °FOR MORE INFORMATION  °Centers for Disease Control and Prevention, Division of STD Prevention: www.cdc.gov/std °American Sexual Health Association (ASHA): www.ashastd.org  °Document Released: 07/27/2005 Document Revised: 05/17/2013 Document Reviewed: 03/08/2013 °ExitCare® Patient  Information ©2015 [employer]. This information is not intended to replace advice given to you by your health care provider. Make sure you discuss any questions you have with your health care provider. ° ° °

## 2014-07-10 NOTE — ED Notes (Signed)
Pt c/o abscess to vagina x 5 days Pt is 9 weeks preg

## 2014-07-10 NOTE — ED Provider Notes (Signed)
CSN: [id number]     Arrival date & time 07/10/14  1758 History  This chart was scribed for Mirian MoMatthew Gentry, MD by Gwenyth Oberatherine Macek, ED Scribe. This patient was seen in room MH08/MH08 and the patient's care was started at 6:26 PM.    Chief Complaint  Patient presents with  . Abscess   The history is provided by the patient. No language interpreter was used.    HPI Comments: Maria Harvey is a 28 y.o. female who is [redacted] weeks pregnant and presents to the Emergency Department complaining of a gradually worsening Bartholin's gland abscess and constant, associated pain that started 5 days ago. She states white vaginal discharge as an associated symptom. Pt notes that pain becomes worse with walking. Pt has history of similar symptoms, but states current abscess is larger than normal. She has seen a gynecologist for prior Bartholin's gland abscesses and notes that he did not want to perform marsupialization at the time. She has not seen a gynecologist for current symptoms. Pt denies fever, nausea and vomiting as associated symptoms.  Timing of symptoms is constant.  She specifically denies any abd pain.    History reviewed. No pertinent past medical history. History reviewed. No pertinent past surgical history. History reviewed. No pertinent family history. History  Substance Use Topics  . Smoking status: Never Smoker   . Smokeless tobacco: Not on file  . Alcohol Use: No   OB History    No data available     Review of Systems  Constitutional: Negative for fever.  Gastrointestinal: Negative for nausea and vomiting.  Genitourinary: Positive for vaginal discharge.       Bartholin's Gland abscess  All other systems reviewed and are negative.  Allergies  Review of patient's allergies indicates no known allergies.  Home Medications   Prior to Admission medications   Not on File   BP 115/86 mmHg  Pulse 108  Temp(Src) 98.4 F (36.9 C) (Oral)  Resp 16  Ht 5\' 11"  (1.803 m)  Wt 154 lb  (69.854 kg)  BMI 21.49 kg/m2  SpO2 100%  LMP 06/07/2014 Physical Exam  Constitutional: She is oriented to person, place, and time. She appears well-developed and well-nourished.  HENT:  Head: Normocephalic and atraumatic.  Right Ear: External ear normal.  Left Ear: External ear normal.  Eyes: Conjunctivae and EOM are normal. Pupils are equal, round, and reactive to light.  Neck: Normal range of motion. Neck supple.  Cardiovascular: Normal rate, regular rhythm, normal heart sounds and intact distal pulses.   Pulmonary/Chest: Effort normal and breath sounds normal.  Abdominal: Soft. Bowel sounds are normal. There is no tenderness.  Genitourinary: Cervix exhibits discharge. Cervix exhibits no motion tenderness and no friability. Right adnexum displays no tenderness. Left adnexum displays no tenderness.  bartholins on L  Musculoskeletal: Normal range of motion.  Neurological: She is alert and oriented to person, place, and time.  Skin: Skin is warm and dry.  Vitals reviewed.  Chaperone (scribe) was present for exam which was performed with no discomfort or complications. ED Course  INCISION AND DRAINAGE Date/Time: 07/10/2014 8:56 PM Performed by: Mirian MoGENTRY, MATTHEW Authorized by: Mirian MoGENTRY, MATTHEW Consent: Verbal consent obtained. Type: abscess Body area: anogenital Location details: Bartholin's gland Anesthesia: local infiltration Local anesthetic: lidocaine 1% with epinephrine Scalpel size: 11 Incision type: single straight Complexity: complex Drainage: purulent Drainage amount: copious Wound treatment: drain placed Packing material: ward catheter. Patient tolerance: Patient tolerated the procedure well with no immediate complications   (including critical  care time) DIAGNOSTIC STUDIES: Oxygen Saturation is 100% on RA, normal by my interpretation.    COORDINATION OF CARE: 6:30 PM Discussed treatment plan with pt at bedside and pt agreed to plan.   Labs Review Labs Reviewed  - No data to display  Imaging Review No results found.   EKG Interpretation None      MDM   Final diagnoses:  None    28 y.o. female with pertinent PMH of prior bartholin's gland abscess, current pregnancy of ~ 9 weeks presents with recurrent bartholins abscess and vaginal dc.  No abd pain.  No nausea.  Physical exam as above.  Abscess drained as above.  Pt given clindamycin prescription and flagyl.  DC home in stable condition to fu with gyn.    1. Bartholin's gland abscess   2. Bacterial vaginosis           Mirian MoMatthew Gentry, MD 07/10/14 [phone] [id number]

## 2014-07-11 LAB — GC/CHLAMYDIA PROBE AMP
CT PROBE, AMP APTIMA: POSITIVE — AB
GC Probe RNA: NEGATIVE

## 2014-07-12 ENCOUNTER — Telehealth (HOSPITAL_BASED_OUTPATIENT_CLINIC_OR_DEPARTMENT_OTHER): Payer: Self-pay | Admitting: *Deleted

## 2014-08-05 ENCOUNTER — Telehealth (HOSPITAL_COMMUNITY): Payer: Self-pay

## 2014-08-05 NOTE — ED Notes (Signed)
Unable to contact pt by mail or telephone. Unable to communicate lab results or treatment changes. 

## 2014-08-10 NOTE — L&D Delivery Note (Signed)
Patient was C/C/+2 and pushed for 25 minutes with epidural.   NSVD girl infant, Apgars 9,9, weight P.   The patient had one small second degree laceration of left labia repaired with 3-0 vicryl R. Fundus was firm. EBL was expected amount but she had some atony- cytotec 800 mcg administered by nurse rectally.   Placenta was delivered intact. Vagina was clear.  Baby was vigorous and doing skin to skin with mother.  Maria Harvey

## 2014-08-17 LAB — OB RESULTS CONSOLE HIV ANTIBODY (ROUTINE TESTING): HIV: NONREACTIVE

## 2014-08-17 LAB — OB RESULTS CONSOLE ABO/RH: RH Type: POSITIVE

## 2014-08-17 LAB — OB RESULTS CONSOLE RPR: RPR: NONREACTIVE

## 2014-08-17 LAB — OB RESULTS CONSOLE RUBELLA ANTIBODY, IGM: RUBELLA: IMMUNE

## 2014-08-17 LAB — OB RESULTS CONSOLE HEPATITIS B SURFACE ANTIGEN: Hepatitis B Surface Ag: NEGATIVE

## 2014-08-27 ENCOUNTER — Other Ambulatory Visit (HOSPITAL_COMMUNITY): Payer: Self-pay | Admitting: Obstetrics

## 2014-08-27 ENCOUNTER — Inpatient Hospital Stay (HOSPITAL_COMMUNITY): Admission: AD | Admit: 2014-08-27 | Payer: Medicaid Other | Source: Ambulatory Visit | Admitting: Obstetrics

## 2014-08-27 DIAGNOSIS — O34592 Maternal care for other abnormalities of gravid uterus, second trimester: Secondary | ICD-10-CM

## 2014-09-04 ENCOUNTER — Other Ambulatory Visit (HOSPITAL_COMMUNITY): Payer: Self-pay | Admitting: Obstetrics and Gynecology

## 2014-09-04 ENCOUNTER — Other Ambulatory Visit (HOSPITAL_COMMUNITY): Payer: Self-pay

## 2014-09-04 ENCOUNTER — Ambulatory Visit (HOSPITAL_COMMUNITY)
Admission: RE | Admit: 2014-09-04 | Discharge: 2014-09-04 | Disposition: A | Discharge status: Home / Self Care | Payer: Medicaid Other | Source: Ambulatory Visit | Attending: Obstetrics | Admitting: Obstetrics

## 2014-09-04 ENCOUNTER — Encounter (HOSPITAL_COMMUNITY): Payer: Self-pay

## 2014-09-04 ENCOUNTER — Ambulatory Visit (HOSPITAL_COMMUNITY): Admission: RE | Admit: 2014-09-04 | Payer: Medicaid Other | Source: Ambulatory Visit

## 2014-09-04 DIAGNOSIS — Q51818 Other congenital malformations of uterus: Secondary | ICD-10-CM | POA: Diagnosis not present

## 2014-09-04 DIAGNOSIS — O34592 Maternal care for other abnormalities of gravid uterus, second trimester: Secondary | ICD-10-CM | POA: Insufficient documentation

## 2014-09-04 DIAGNOSIS — Z3A18 18 weeks gestation of pregnancy: Secondary | ICD-10-CM | POA: Insufficient documentation

## 2014-09-04 DIAGNOSIS — Z36 Encounter for antenatal screening of mother: Secondary | ICD-10-CM | POA: Diagnosis not present

## 2014-09-04 HISTORY — DX: Other specified health status: Z78.9

## 2014-09-04 IMAGING — US US OB DETAIL+14 WK
1 series · 12 of 28 positions shown · non-contrast
Comparison: none

OBSTETRICS REPORT
                      (Signed Final 09/04/2014 [DATE])

Service(s) Provided
 US OB DETAIL + 14 WK                                  76811.0
Indications
 Detailed fetal anatomic survey                        Z36
 18 weeks gestation of pregnancy
 Adnexal mass, complex or unspecified
 Uterine abnormality during pregnancy
Fetal Evaluation
 Num Of Fetuses:    1
 Fetal Heart Rate:  145                          bpm
 Cardiac Activity:  Observed
 Presentation:      Variable
 Placenta:          Posterior
 P. Cord            Visualized, central
 Insertion:
 Amniotic Fluid
 AFI FV:      Subjectively within normal limits
                                             Larg Pckt:    3.23  cm
Biometry
 BPD:     37.8  mm     G. Age:  17w 4d                CI:         70.1   70 - 86
                                                      FL/HC:      17.0   15.8 -
                                                                         18
 HC:       144  mm     G. Age:  17w 4d       24  %    HC/AC:      1.13   1.07 -
 AC:       127  mm     G. Age:  18w 2d       57  %    FL/BPD:
 FL:      24.5  mm     G. Age:  17w 3d       23  %    FL/AC:      19.3   20 - 24
 HUM:     24.2  mm     G. Age:  17w 4d       41  %
 Est. FW:     212  gm      0 lb 7 oz     46  %
Gestational Age
 LMP:           17w 4d        Date:  05/04/14                 EDD:   02/08/15
 U/S Today:     17w 5d                                        EDD:   02/07/15
 Best:          18w 0d     Det. By:  Early Ultrasound         EDD:   02/05/15
                                     (07/19/14)
Anatomy
 Cranium:          Appears normal         Aortic Arch:      Not well visualized
 Fetal Cavum:      Appears normal         Ductal Arch:      Appears normal
 Ventricles:       Appears normal         Diaphragm:        Not well visualized
 Choroid Plexus:   Appears normal         Stomach:          Appears normal, left
                                                            sided
 Cerebellum:       Appears normal         Abdomen:          Appears normal
 Posterior Fossa:  Appears normal         Abdominal Wall:   Appears nml (cord
                                                            insert, abd wall)
 Nuchal Fold:      Appears normal         Cord Vessels:     Appears normal (3
                                                            vessel cord)
 Face:             Appears normal         Kidneys:          Appear normal
                   (orbits and profile)
 Lips:             Appears normal         Bladder:          Appears normal
 Palate:           Not well visualized    Spine:            Appears normal
 Heart:            Not well visualized    Lower             Not well visualized
                                          Extremities:
 RVOT:             Not well visualized    Upper             Appears normal
 LVOT:             Appears normal
 Other:  Parents do not wish to know sex of fetus. Heels and 5th digit
         visualized. Nasal bone visualized. Technically difficult due to maternal
         habitus and fetal position.
Cervix Uterus Adnexa
 Uterus:       No abnormality visualized.
 Cul De Sac:   No free fluid seen.
 Left Ovary:    Within normal limits.
 Right Ovary:   Not visualized.
Impression
INDICATION: 28 yr old EOQFFYF at 86w0d for fetal anatomic
 survey. Severe constipation.

[Series 1: us ob detail+14 wk · 0.17mm/px · 12 of 92 slices shown]
[im 4/92]
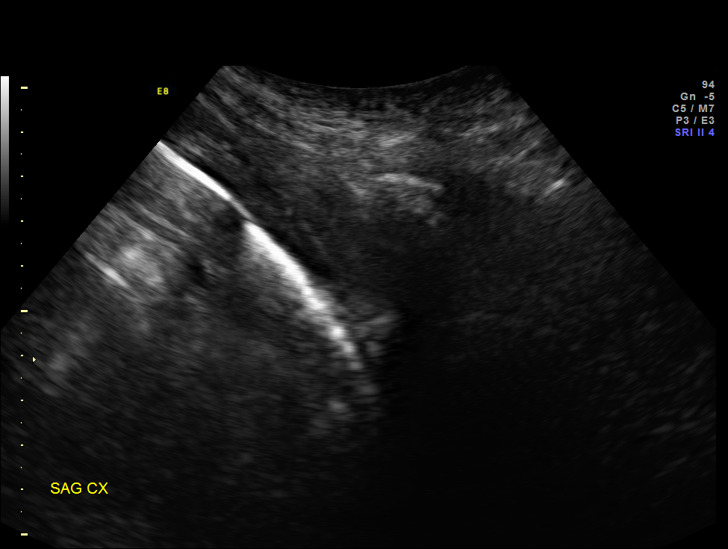
[im 11/92]
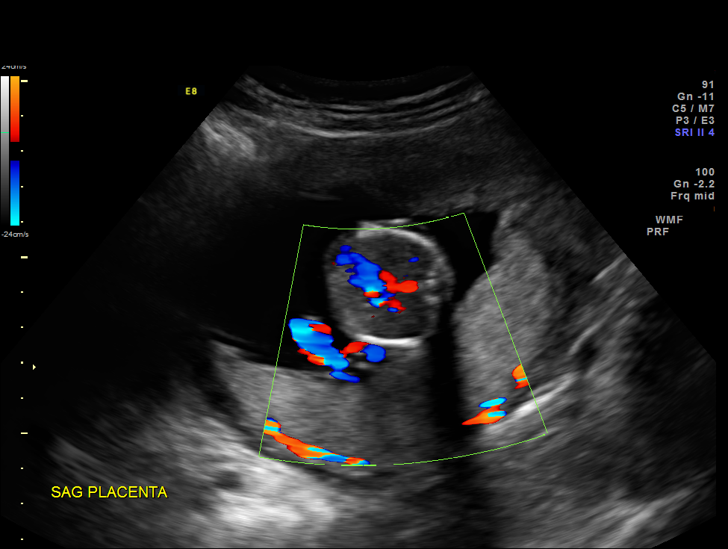
[im 17/92]
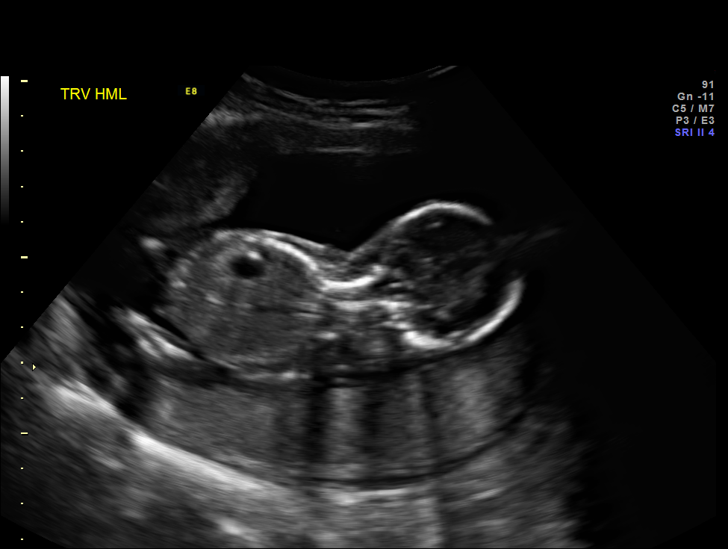
[im 27/92]
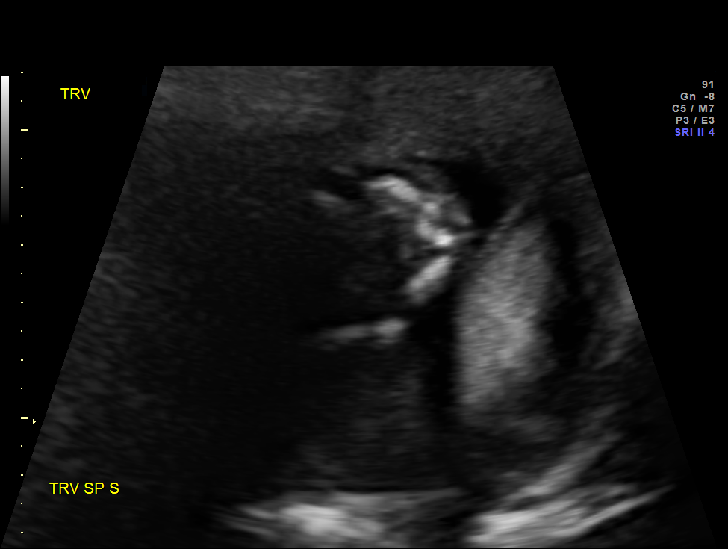
[im 34/92]
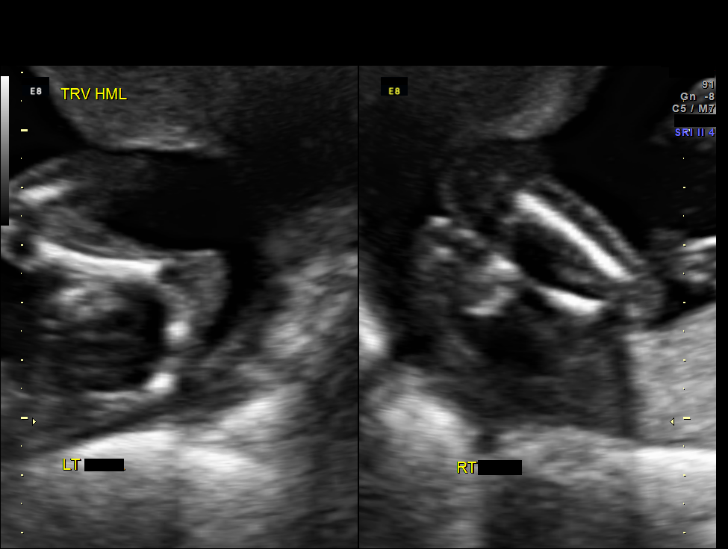
[im 41/92]
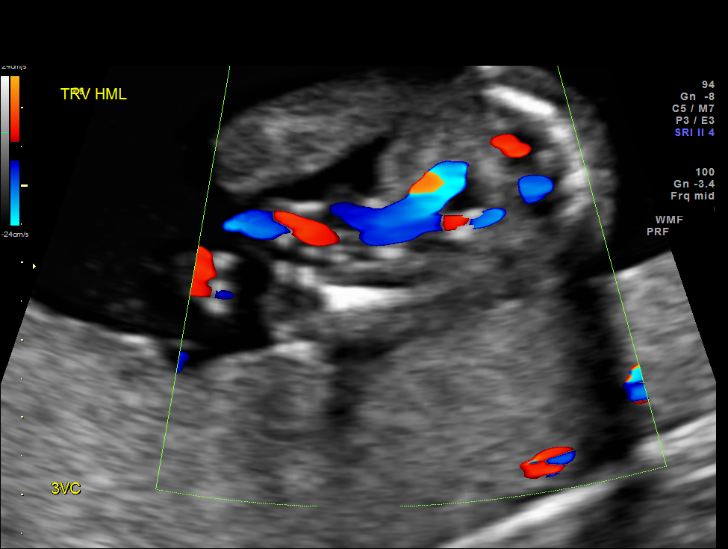
[im 51/92]
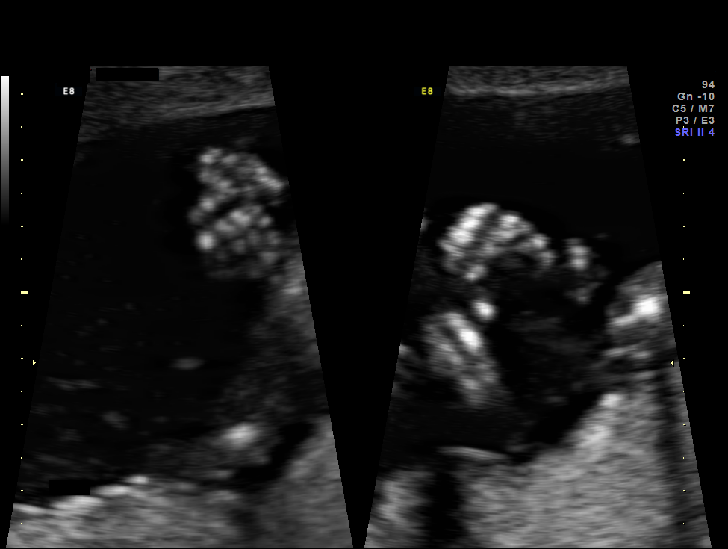
[im 58/92]
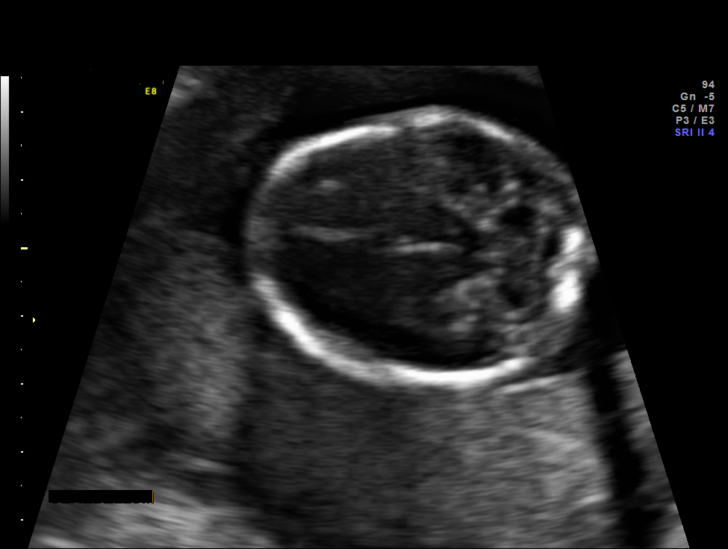
[im 65/92]
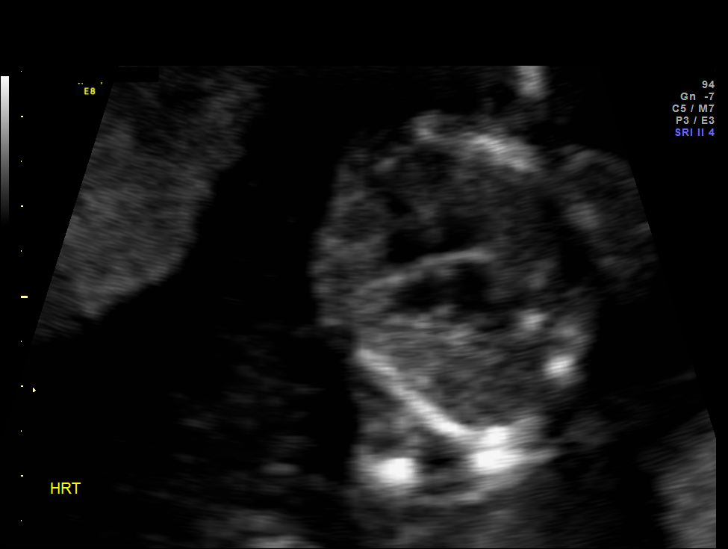
[im 75/92]
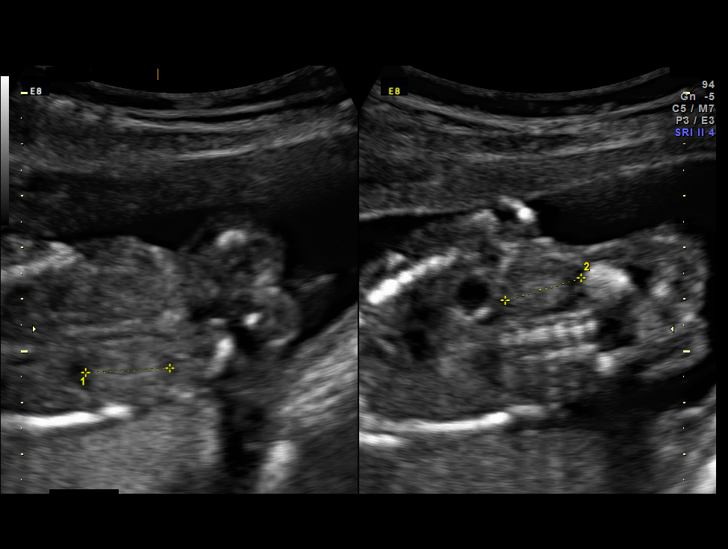
[im 81/92]
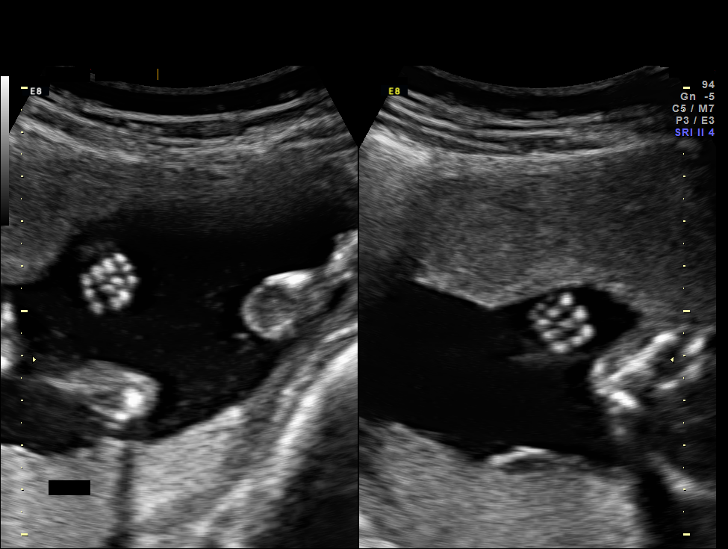
[im 88/92]
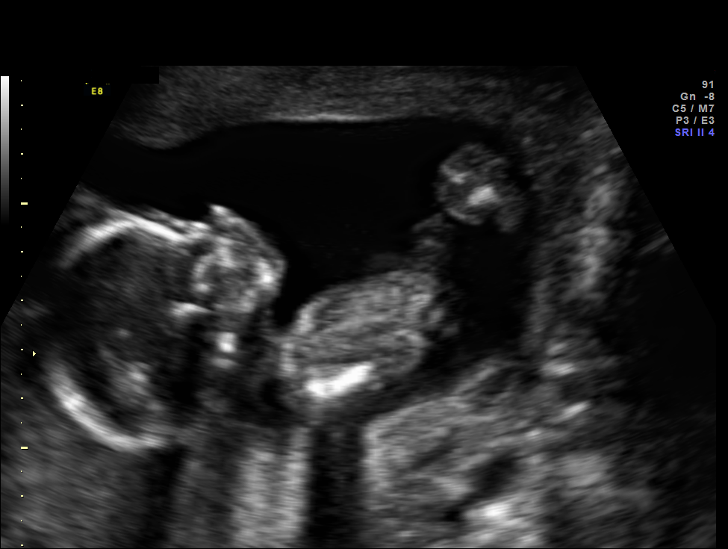

[12 of 28 positions shown; findings below may reference images not displayed]

FINDINGS: 1. Single intrauterine pregnancy.
 2. Fetal biometry is consistent with dating.
 3. Posterior placenta; the lower uterine segment is not well
 seen.
 4. Normal amniotic fluid volume.
 5. The cervix is not well visualized.
 6. The uterus is deviated to the right by a left pelvic mass that
 is consistent with stool.
 7. The views of the palate, heart, and ankles are limited.
 8. The remainder of the limited anatomy survey is normal.
Recommendations

 1. Appropriate fetal growth.
 2. Limited anatomy survey:
 - recommend follow up in 4 weeks to complete anatomic
 survey
 3. Pelvic mass:
 - consistent with impacted stool
 - patient reports this has been a chronic problem since
 childhood
 - has been on mag citrate and miralax without relief
 - no nausea vomiting or pain currently
 - recommend consult with GI this week for recommendations;
 will likely need Go Lytely; may need admission (has required
 NG tube in the past)
 - will need daily aggressive bowel regimen
 - reevaluate ultrasound after resolution of constipation

 Spoke with Dr. Jumper about recommendations. She will set
 up GI consult for this week.

## 2014-09-04 NOTE — Progress Notes (Signed)
[facility] ultrasound  Indication: 29 yr old G4P0030 at 5681w0d for fetal anatomic survey. Severe constipation.  Findings: 1. Single intrauterine pregnancy. 2. Fetal biometry is consistent with dating. 3. Posterior placenta; the lower uterine segment is not well seen. 4. Normal amniotic fluid volume. 5. The cervix is not well visualized. 6. The uterus is deviated to the right by a left pelvic mass that is consistent with stool. 7. The views of the palate, heart, and ankles are limited. 8. The remainder of the limited anatomy survey is normal.  Recommendations: 1. Appropriate fetal growth. 2. Limited anatomy survey: - recommend follow up in 4 weeks to complete anatomic survey 3. Pelvic mass: - consistent with impacted stool - patient reports this has been a chronic problem since childhood - has been on mag citrate and miralax without relief - no nausea vomiting or pain currently - recommend consult with GI this week for recommendations; will likely need Go Lytely; may need admission (has required NG tube in the past) - will need daily aggressive bowel regimen - reevaluate ultrasound after resolution of constipation  Spoke with Dr. Henderson CloudHorvath about recommendations. She will set up GI consult for this week.  Maria FosterKristen Collyn Selk, MD

## 2014-10-02 ENCOUNTER — Encounter (HOSPITAL_COMMUNITY): Payer: Self-pay

## 2014-10-02 ENCOUNTER — Ambulatory Visit (HOSPITAL_COMMUNITY)
Admission: RE | Admit: 2014-10-02 | Discharge: 2014-10-02 | Disposition: A | Discharge status: Home / Self Care | Payer: Medicaid Other | Source: Ambulatory Visit | Attending: Obstetrics | Admitting: Obstetrics

## 2014-10-02 DIAGNOSIS — Z36 Encounter for antenatal screening of mother: Secondary | ICD-10-CM | POA: Insufficient documentation

## 2014-10-02 DIAGNOSIS — Z3A22 22 weeks gestation of pregnancy: Secondary | ICD-10-CM | POA: Insufficient documentation

## 2014-10-02 DIAGNOSIS — K5909 Other constipation: Secondary | ICD-10-CM | POA: Insufficient documentation

## 2014-10-02 DIAGNOSIS — O34592 Maternal care for other abnormalities of gravid uterus, second trimester: Secondary | ICD-10-CM

## 2014-10-02 DIAGNOSIS — IMO0002 Reserved for concepts with insufficient information to code with codable children: Secondary | ICD-10-CM | POA: Insufficient documentation

## 2014-10-02 DIAGNOSIS — Z0489 Encounter for examination and observation for other specified reasons: Secondary | ICD-10-CM | POA: Insufficient documentation

## 2014-10-02 DIAGNOSIS — O2690 Pregnancy related conditions, unspecified, unspecified trimester: Secondary | ICD-10-CM | POA: Insufficient documentation

## 2014-10-02 IMAGING — US US OB FOLLOW-UP
1 series · 12 of 28 positions shown · non-contrast
Comparison: none

[Series 1: us ob follow-up · 0.23mm/px · 12 of 57 slices shown]
[im 3/57]
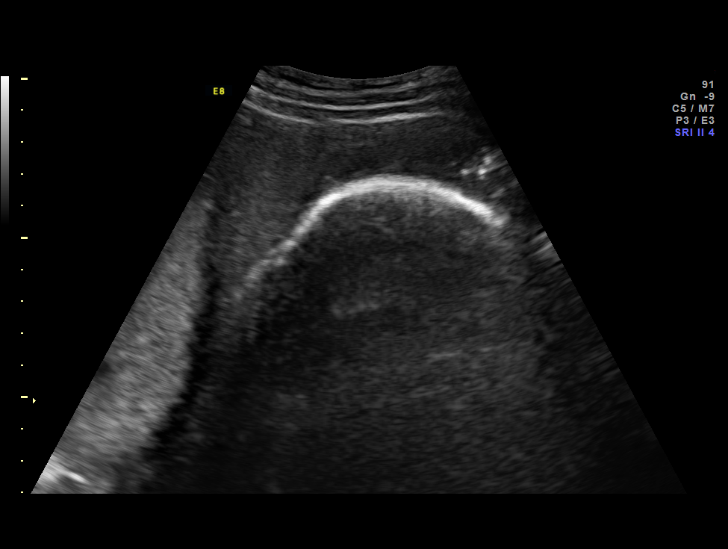
[im 7/57]
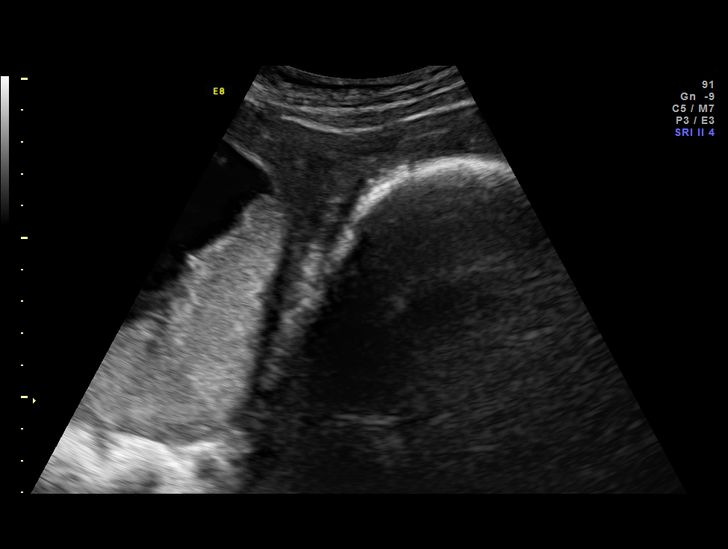
[im 11/57]
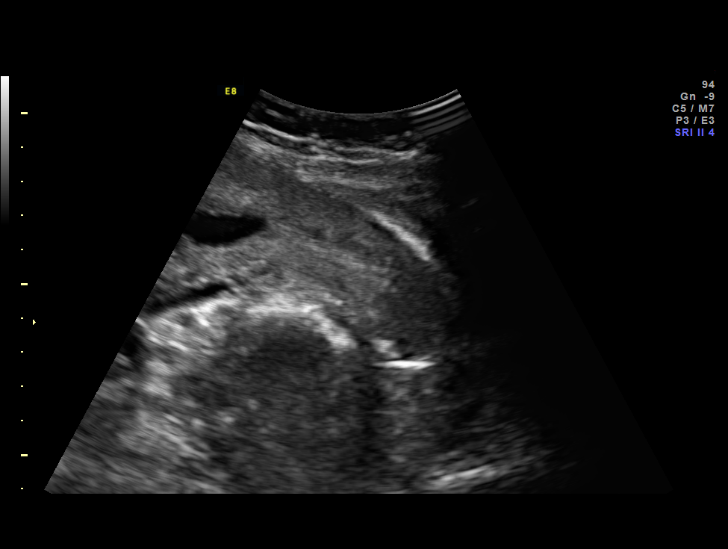
[im 17/57]
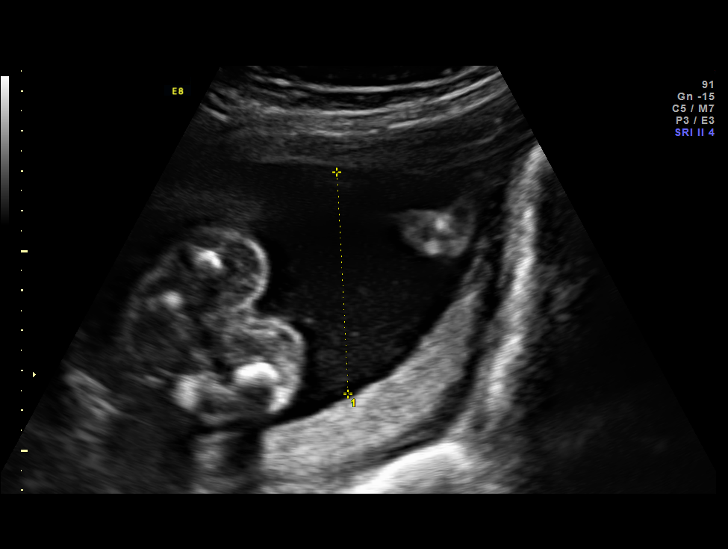
[im 21/57]
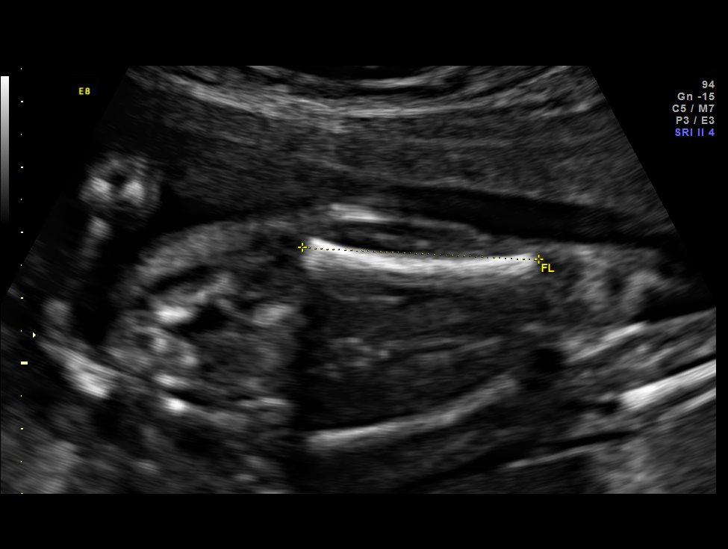
[im 25/57]
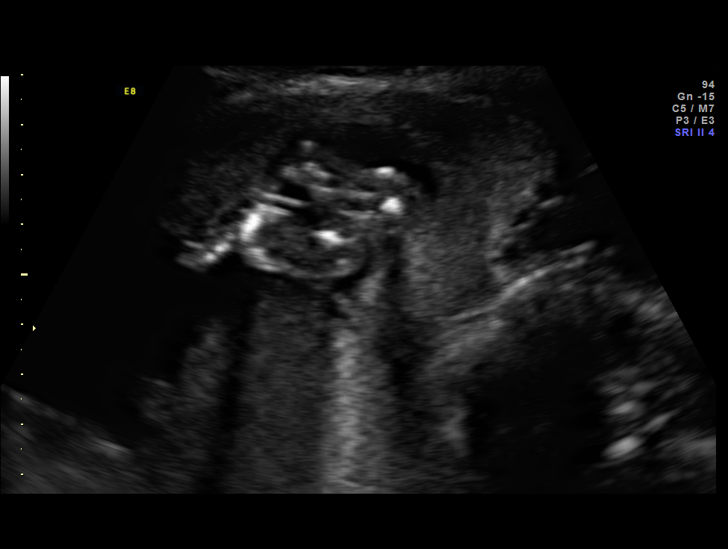
[im 32/57]
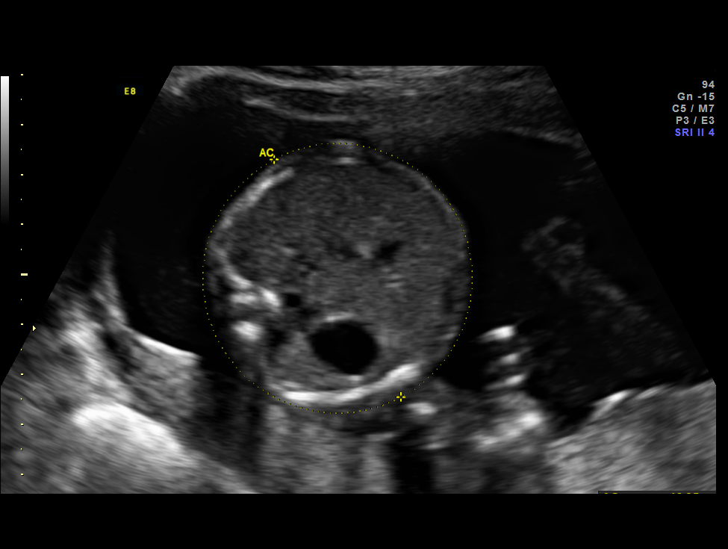
[im 36/57]
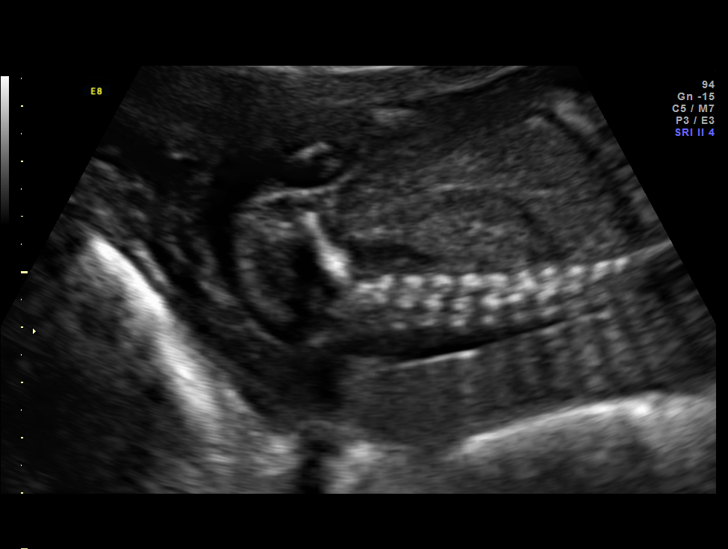
[im 40/57]
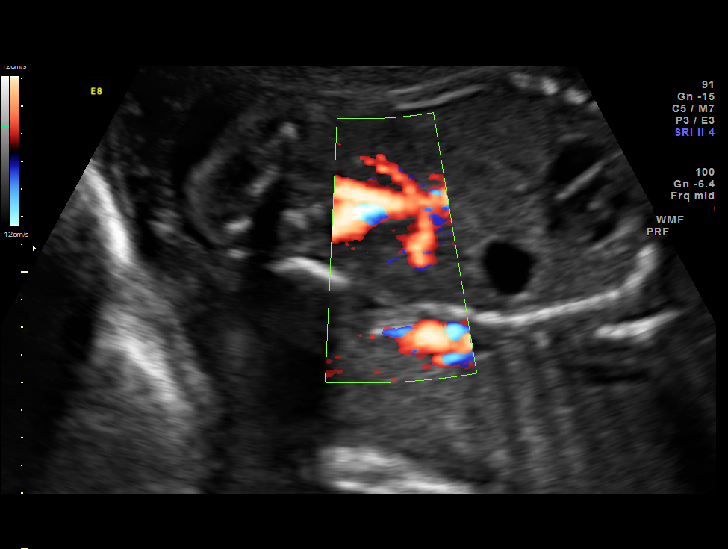
[im 46/57]
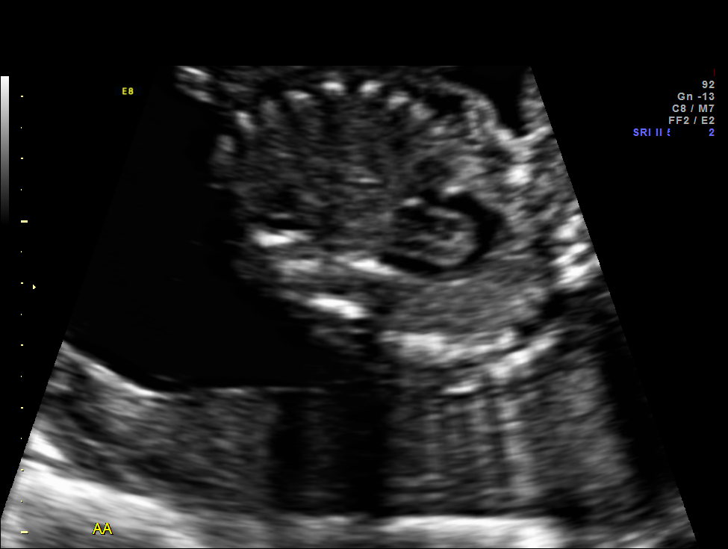
[im 50/57]
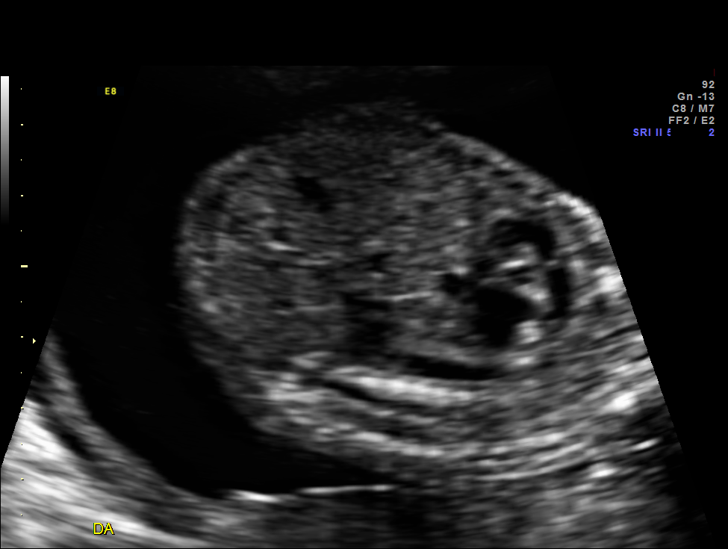
[im 54/57]
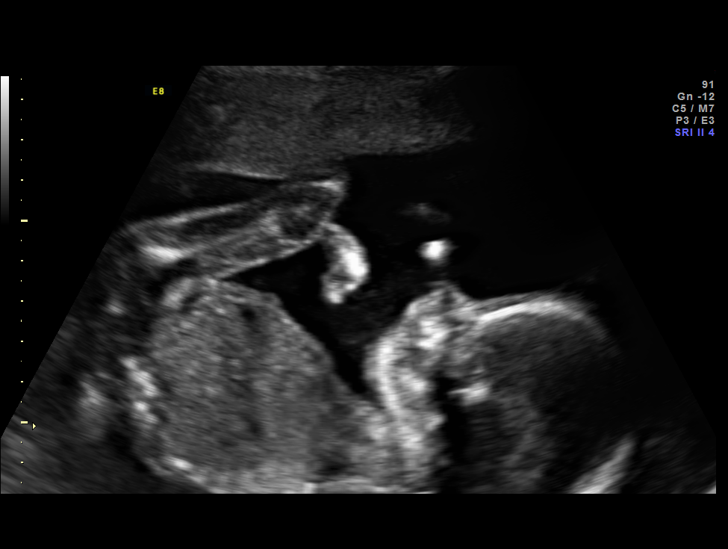

[12 of 28 positions shown; findings below may reference images not displayed]

OBSTETRICS REPORT
                      (Signed Final 10/02/2014 [DATE])

Service(s) Provided

 US OB FOLLOW UP                                       76816.1
Indications

 22 weeks gestation of pregnancy
 Follow-up incomplete fetal anatomic evaluation        Z36
 Medical complication of pregnancy (specify):
 chronic constipation
Fetal Evaluation

 Num Of Fetuses:    1
 Fetal Heart Rate:  139                          bpm
 Cardiac Activity:  Observed
 Presentation:      Variable
 Placenta:          Posterior
 P. Cord            Previously Visualized
 Insertion:

 Amniotic Fluid
 AFI FV:      Subjectively within normal limits
                                             Larg Pckt:     5.6  cm
Biometry

 BPD:     48.4  mm     G. Age:  20w 4d                CI:         71.4   70 - 86
 OFD:     67.8  mm                                    FL/HC:      19.2   18.4 -

 HC:     188.1  mm     G. Age:  21w 1d       10  %    HC/AC:      1.09   1.06 -

 AC:     171.9  mm     G. Age:  22w 1d       47  %    FL/BPD:     74.8   71 - 87
 FL:      36.2  mm     G. Age:  21w 3d       23  %    FL/AC:      21.1   20 - 24
 HUM:     35.6  mm     G. Age:  22w 3d       57  %

 Est. FW:     446  gm           1 lb     38  %
Gestational Age

 LMP:           21w 4d        Date:  05/04/14                 EDD:   02/08/15
 U/S Today:     21w 2d                                        EDD:   02/10/15
 Best:          22w 0d     Det. By:  Early Ultrasound         EDD:   02/05/15
                                     (07/19/14)
Anatomy
 Cranium:          Previously seen        Aortic Arch:      Appears normal
 Fetal Cavum:      Previously seen        Ductal Arch:      Appears normal
 Ventricles:       Appears normal         Diaphragm:        Appears normal
 Choroid Plexus:   Previously seen        Stomach:          Appears normal, left
                                                            sided
 Cerebellum:       Previously seen        Abdomen:          Previously seen
 Posterior Fossa:  Previously seen        Abdominal Wall:   Previously seen
 Nuchal Fold:      Previously seen        Cord Vessels:     Previously seen
 Face:             Orbits and profile     Kidneys:          Appear normal
                   previously seen
 Lips:             Previously seen        Bladder:          Appears normal
 Heart:            Appears normal         Spine:            Previously seen
                   (4CH, axis, and
                   situs)
 RVOT:             Appears normal         Lower             Appears normal
                                          Extremities:
 LVOT:             Appears normal         Upper             Previously seen
                                          Extremities:

 Other:  Parents do not wish to know sex of fetus. Heels and 5th digit
         previously seen.
Cervix Uterus Adnexa

 Cervix:       Normal appearance by transabdominal scan. Appears
               closed, without funnelling.
Impression

 SIUP at 22+0 weeks
 Normal interval anatomy; anatomic survey complete
 Normal amniotic fluid volume
 Appropriate interval growth with EFW at the 38th %tile
 There appeared to be no change in the amount of stool in
 maternal pelvis as compared to the [DATE] study; cervix and
 uterus displaced superiorly and cephalad

 Dr. Giorgi informed.
Recommendations

 Follow-up as clinically indicated
 As per Dr. G-Recalde recommendations

## 2014-10-02 NOTE — ED Notes (Signed)
Dr. Henderson CloudHorvath notified of pts severe constipation, will get with Dr. Chestine Sporelark for plan of care, GI specialist involved.

## 2014-11-14 ENCOUNTER — Other Ambulatory Visit: Payer: Self-pay

## 2014-11-14 LAB — OB RESULTS CONSOLE GC/CHLAMYDIA
Chlamydia: NEGATIVE
Gonorrhea: NEGATIVE

## 2014-11-15 ENCOUNTER — Encounter: Payer: Self-pay | Admitting: Gastroenterology

## 2014-11-20 ENCOUNTER — Ambulatory Visit (INDEPENDENT_AMBULATORY_CARE_PROVIDER_SITE_OTHER): Payer: Medicaid Other | Admitting: Physician Assistant

## 2014-11-20 ENCOUNTER — Encounter: Payer: Self-pay | Admitting: Physician Assistant

## 2014-11-20 VITALS — BP 104/70 | HR 92 | Ht 71.0 in | Wt 184.8 lb

## 2014-11-20 DIAGNOSIS — K5900 Constipation, unspecified: Secondary | ICD-10-CM

## 2014-11-20 NOTE — Progress Notes (Signed)
Patient ID: Maria Harvey, female   DOB: [birth year], 29 y.o.   MRN: [id number]    HPI:  Maria Harvey is a 29 y.o.   female referred by Coralyn Mark, MD for evaluation of constipation.  [name] states she has been constipated since she was a child. She is accompanied to this visit by her mother who states she was told when the portion was a small child that she just didn't want to go to the bathroom." She typically would skip a day or 2 between bowel movements and then passed a hard stool. She is currently [redacted] weeks pregnant. She has been having a daily bowel movement but states it is small nuggets that she has to strain to evacuate. She gets lower abdominal cramping prior to defecation relieved with defecation. She has had no bright red blood per rectum or melena. She readily admits that she drinks very little water and drinks primarily Coca-Cola throughout the day. She does eat fruits and vegetables, but only about 1 serving per day. She has tried [place] lax intermittently and feels it doesn't help.   Past Medical History  Diagnosis Date  . Medical history non-contributory     Past Surgical History  Procedure Laterality Date  . No past surgeries     Family History  Problem Relation Age of Onset  . Diabetes Father   . Colon cancer Neg Hx   . Esophageal cancer Neg Hx   . Liver disease Neg Hx   . Kidney disease Neg Hx   . Heart disease Neg Hx    History  Substance Use Topics  . Smoking status: Never Smoker   . Smokeless tobacco: Not on file  . Alcohol Use: No   Current Outpatient Prescriptions  Medication Sig Dispense Refill  . Polyethylene Glycol 3350 (MIRALAX PO) Take 17 g by mouth as needed.     . Prenatal Vit-Fe Fumarate-FA (PRENATAL VITAMIN PO) Take 1 capsule by mouth daily.      No current facility-administered medications for this visit.   No Known Allergies   Review of Systems: Gen: Denies any fever, chills, sweats, anorexia, fatigue, weakness, malaise,  weight loss, and sleep disorder CV: Denies chest pain, angina, palpitations, syncope, orthopnea, PND, peripheral edema, and claudication. Resp: Denies dyspnea at rest, dyspnea with exercise, cough, sputum, wheezing, coughing up blood, and pleurisy. GI: Denies vomiting blood, jaundice, and fecal incontinence.   Denies dysphagia or odynophagia. GU : Denies urinary burning, blood in urine, urinary frequency, urinary hesitancy, nocturnal urination, and urinary incontinence. MS: Denies joint pain, limitation of movement, and swelling, stiffness, low back pain, extremity pain. Denies muscle weakness, cramps, atrophy.  Derm: Denies rash, itching, dry skin, hives, moles, warts, or unhealing ulcers.  Psych: Denies depression, anxiety, memory loss, suicidal ideation, hallucinations, paranoia, and confusion. Heme: Denies bruising, bleeding, and enlarged lymph nodes. Neuro:  Denies any headaches, dizziness, paresthesias. Endo:  Denies any problems with DM, thyroid, adrenal function    Physical Exam: BP 104/70 mmHg  Pulse 92  Ht  (1.803 m)  Wt 184 lb 12.8 oz (83.825 kg)  BMI 25.79 kg/m2  LMP 06/07/2014 Constitutional: Pleasant,well-developed, pregnant t AA female in no acute distress. HEENT: Normocephalic and atraumatic. Conjunctivae are normal. No scleral icterus. Neck supple. No cervical adenopathy Cardiovascular: Normal rate, regular rhythm.  Pulmonary/chest: Effort normal and breath sounds normal. No wheezing, rales or rhonchi. Abdominal: Soft, gravid uterus,, nontender. Bowel sounds active throughout.  No hepatomegaly. Extremities: no edema Lymphadenopathy: No cervical  adenopathy noted. Neurological: Alert and oriented to person place and time. Skin: Skin is warm and dry. No rashes noted. Psychiatric: Normal mood and affect. Behavior is normal.  ASSESSMENT AND PLAN: 29 year old female who was [redacted] weeks pregnant referred for evaluation of constipation. It was explained to the patient  that some of her constipation may be exacerbated Ibyher fluctuating hormone levels during pregnancy. She has been instructed to use [place] lax 3 capfuls in 1 quart of Gatorade later today. She may use a glycerin suppository daily as needed. She was instructed to add "P fruits" to her diet (see peaches, pears, prunes, plums, pineapple). She has been instructed to drink at least 6 bottles of water daily. She is to call us in 1 week if she has not had any relief, otherwise she will follow up in 3 weeks.    Anderson Antolin, Tollie PizzaLori P PA-C 11/20/2014, 12:56 PM  CC: Dyanna Lizabeth LeydenGeffel Clark, MD

## 2014-11-20 NOTE — Patient Instructions (Signed)
Use miralax 3 capfuls in 1 quart gatorade today. Use glycerin suppositories as needed. Eat the "P" fruits, peaches, pears, prunes, pineapple, plums. Drink at least 6 water bottles daily.   Call in 1-1 1/2 weeks to update on your condition. Follow up with Lawson FiscalLori on  12/07/14 at 8:15 am. CC:  Maria Harvey

## 2014-11-28 NOTE — Progress Notes (Signed)
Agree w/ Ms. Hvozdovic's note and mangement.  

## 2014-12-07 ENCOUNTER — Ambulatory Visit (INDEPENDENT_AMBULATORY_CARE_PROVIDER_SITE_OTHER): Payer: Medicaid Other | Admitting: Physician Assistant

## 2014-12-07 ENCOUNTER — Encounter: Payer: Self-pay | Admitting: Physician Assistant

## 2014-12-07 VITALS — BP 104/60 | HR 100 | Ht 70.0 in | Wt 184.4 lb

## 2014-12-07 DIAGNOSIS — K5900 Constipation, unspecified: Secondary | ICD-10-CM

## 2014-12-07 MED ORDER — GLYCERIN (LAXATIVE) 2 G RE SUPP: 1.0000 | Freq: Once | RECTAL | Status: DC

## 2014-12-07 NOTE — Patient Instructions (Signed)
Increase your Miralax to 1 capful in the morning and 2 capfuls at bedtime.  Increase your fluid intake  Continue eating "P" fruits  Use over the counter glycerin suppositories as needed   Follow up after delivery, sooner if needed

## 2014-12-07 NOTE — Progress Notes (Signed)
Patient ID: Maria Harvey, female   DOB: [birth year], 29 y.o.   MRN: [id number]     History of Present Illness: Maria PeaLaporsha K Filippone is a delightful 29 year old African-American female who is currently [redacted] weeks pregnant. She was last seen on April 12 with complaints of constipation. She states that she has been constipated since early childhood. Since her last visit she's been using Mira lax one capful daily along with apples and pears. She has increased her water intake. She is currently having a daily bowel movement but states she either strains to pass small nuggets or passes a dry hard small long-type stool. She has had no bright red blood per rectum or melena. She has no abdominal pain. She has no nausea or vomiting.   Past Medical History  Diagnosis Date  . Medical history non-contributory     Past Surgical History  Procedure Laterality Date  . No past surgeries     Family History  Problem Relation Age of Onset  . Diabetes Father   . Colon cancer Neg Hx   . Esophageal cancer Neg Hx   . Liver disease Neg Hx   . Kidney disease Neg Hx   . Heart disease Neg Hx    History  Substance Use Topics  . Smoking status: Never Smoker   . Smokeless tobacco: Not on file  . Alcohol Use: No   Current Outpatient Prescriptions  Medication Sig Dispense Refill  . Polyethylene Glycol 3350 (MIRALAX PO) Take 17 g by mouth daily.     . Prenatal Vit-Fe Fumarate-FA (PRENATAL VITAMIN PO) Take 1 capsule by mouth daily.     Marland Kitchen. glycerin adult (GLYCERIN ADULT) 2 G SUPP Place 1 suppository rectally once. 10 suppository 0   No current facility-administered medications for this visit.   No Known Allergies    Review of Systems: Per history of present illness, otherwise negative    Physical Exam: General: Pleasant, well developed ,female in no acute distress Head: Normocephalic and atraumatic Eyes:  sclerae anicteric, conjunctiva pink  Ears: Normal auditory acuity Lungs: Clear throughout to  auscultation Heart: Regular rate and rhythm Abdomen:  non distended, non-tender. Gravid. No masses, no hepatomegaly. Normal bowel sounds Musculoskeletal: Symmetrical with no gross deformities  Extremities: No edema  Neurological: Alert oriented x 4, grossly nonfocal Psychological:  Alert and cooperative. Normal mood and affect  Assessment and Recommendations:  29 year old female who is [redacted] weeks pregnant here for follow-up of constipation. She has had some improvement with Mira lax and increasing fluid. She has been advised to increase her Mira lax to one capful in the morning with 1-2 capfuls at bedtime. She may use a glycerin suppository as needed she will continue to add "P fruits" to her diet and try to further increase her water intake. She will follow up after she delivers as she is interested in a trial of either an Amitiza or Linzess as she has never tried been before. If she has further problems before she delivers, she will call.       Tarin Johndrow, Moise BoringLori P PA-C 12/07/2014,

## 2014-12-10 NOTE — Progress Notes (Signed)
Agree w/ Maria Harvey's note and mangement.  

## 2015-01-02 ENCOUNTER — Other Ambulatory Visit: Payer: Self-pay

## 2015-01-02 LAB — OB RESULTS CONSOLE GBS: STREP GROUP B AG: NEGATIVE

## 2015-01-13 ENCOUNTER — Encounter (HOSPITAL_COMMUNITY): Payer: Self-pay

## 2015-01-13 ENCOUNTER — Inpatient Hospital Stay (HOSPITAL_COMMUNITY)
Admission: AD | Admit: 2015-01-13 | Discharge: 2015-01-13 | Disposition: A | Discharge status: Home / Self Care | Payer: Medicaid Other | Source: Ambulatory Visit | Attending: Obstetrics & Gynecology | Admitting: Obstetrics & Gynecology

## 2015-01-13 DIAGNOSIS — K5900 Constipation, unspecified: Secondary | ICD-10-CM | POA: Diagnosis not present

## 2015-01-13 DIAGNOSIS — O2343 Unspecified infection of urinary tract in pregnancy, third trimester: Secondary | ICD-10-CM | POA: Insufficient documentation

## 2015-01-13 DIAGNOSIS — Z3A36 36 weeks gestation of pregnancy: Secondary | ICD-10-CM | POA: Diagnosis not present

## 2015-01-13 DIAGNOSIS — N898 Other specified noninflammatory disorders of vagina: Secondary | ICD-10-CM | POA: Diagnosis present

## 2015-01-13 DIAGNOSIS — B9689 Other specified bacterial agents as the cause of diseases classified elsewhere: Secondary | ICD-10-CM

## 2015-01-13 DIAGNOSIS — Z0371 Encounter for suspected problem with amniotic cavity and membrane ruled out: Secondary | ICD-10-CM

## 2015-01-13 DIAGNOSIS — N760 Acute vaginitis: Secondary | ICD-10-CM

## 2015-01-13 HISTORY — DX: Constipation, unspecified: K59.00

## 2015-01-13 LAB — URINALYSIS, ROUTINE W REFLEX MICROSCOPIC
BILIRUBIN URINE: NEGATIVE
GLUCOSE, UA: NEGATIVE mg/dL
Ketones, ur: 15 mg/dL — AB
Nitrite: POSITIVE — AB
PH: 6 (ref 5.0–8.0)
Protein, ur: 100 mg/dL — AB
Specific Gravity, Urine: 1.025 (ref 1.005–1.030)
Urobilinogen, UA: 4 mg/dL — ABNORMAL HIGH (ref 0.0–1.0)

## 2015-01-13 LAB — WET PREP, GENITAL
TRICH WET PREP: NONE SEEN
YEAST WET PREP: NONE SEEN

## 2015-01-13 LAB — URINE MICROSCOPIC-ADD ON

## 2015-01-13 MED ORDER — METRONIDAZOLE 500 MG PO TABS: 500.0000 mg | ORAL_TABLET | Freq: Two times a day (BID) | ORAL | Status: DC

## 2015-01-13 NOTE — MAU Note (Signed)
Pt states woke up with wet panties, otherwise no leaking of fluid. No bleeding.

## 2015-01-13 NOTE — MAU Note (Signed)
None     Chief Complaint:  Vaginal Discharge   Maria Harvey is  29 y.o. G4P0030 at [redacted]w[redacted]d presents complaining of Vaginal Discharge She woke up at 11am and noticed that her dress was wet.  She has not had any leaking since then. She has severe chronic constipation, so much so that her cervix has been unreachable and [place] has showed large pelvic mass, presumed by MFM to be stool, which was unchanged on subsequent [place]. MD is concerned that a vaginal delivery may be impossible. She has had GI consults, on dailiy lactulose, with some relief.  Also on daily Macrobid for chronic UTI.   Obstetrical/Gynecological History: OB History    Gravida Para Term Preterm AB TAB SAB Ectopic Multiple Living   0     Past Medical History: Past Medical History  Diagnosis Date  . Medical history non-contributory   . Constipation     severe. since childhood    Past Surgical History: Past Surgical History  Procedure Laterality Date  . No past surgeries    . Dilation and curettage of uterus      Family History: Family History  Problem Relation Age of Onset  . Diabetes Father   . Colon cancer Neg Hx   . Esophageal cancer Neg Hx   . Liver disease Neg Hx   . Kidney disease Neg Hx   . Heart disease Neg Hx     Social History: History  Substance Use Topics  . Smoking status: Never Smoker   . Smokeless tobacco: Not on file  . Alcohol Use: No    Allergies: No Known Allergies  Meds:  Prescriptions prior to admission  Medication Sig Dispense Refill Last Dose  . Lactulose 20 GM/30ML SOLN Take 15 mLs by mouth 2 (two) times daily.   01/12/2015 at Unknown time  . nitrofurantoin, macrocrystal-monohydrate, (MACROBID) 100 MG capsule Take 100 mg by mouth 2 (two) times daily. Prophylaxis regimen   01/12/2015 at Unknown time  . Polyethylene Glycol 3350 (MIRALAX PO) Take 17 g by mouth daily.    01/12/2015 at Unknown time  . glycerin adult (GLYCERIN ADULT) 2 G SUPP Place 1 suppository rectally  once. (Patient not taking: Reported on 01/13/2015) 10 suppository 0     Review of Systems   Constitutional: Negative for fever and chills Eyes: Negative for visual disturbances Respiratory: Negative for shortness of breath, dyspnea Cardiovascular: Negative for chest pain or palpitations  Gastrointestinal: Negative for vomiting Genitourinary: Negative for dysuria and urgency.  Denies vaginal itch or irritation Musculoskeletal: Negative for back pain, joint pain, myalgias.  Normal ROM  Neurological: Negative for dizziness and headaches    Physical Exam  Blood pressure 129/92, pulse 95, temperature 97.7 F (36.5 C), temperature source Oral, resp. rate 18, height  (1.803 m), weight 86.75 kg (191 lb 4 oz), last menstrual period 06/07/2014. GENERAL: Well-developed, well-nourished female in no acute distress.  LUNGS: Clear to auscultation bilaterally.  HEART: Regular rate and rhythm. ABDOMEN: Soft, nontender, nondistended, gravid.  EXTREMITIES: Nontender, no edema, 2+ distal pulses. DTR's 2+ PELVIC EXAM: Unable to see cervix with speculum.  Moderate amount frothy white dc with an amine odor.  No pooling of fluid, valsalva and fern negative.  Digital exam:  Unable to feel cervix.  It is displaced superiorly FHT:  Baseline rate 130 bpm   Variability moderate  Accelerations present   Decelerations none Contractions: Every 0 mins   Labs: Results for  orders placed or performed during the hospital encounter of 01/13/15 (from the past 24 hour(s))  Urinalysis, Routine w reflex microscopic ([facility])   Collection Time: 01/13/15 12:45 PM  Result Value Ref Range   Color, Urine YELLOW YELLOW   APPearance CLOUDY (A) CLEAR   Specific Gravity, Urine 1.025 1.005 - 1.030   pH 6.0 5.0 - 8.0   Glucose, UA NEGATIVE NEGATIVE mg/dL   Hgb urine dipstick MODERATE (A) NEGATIVE   Bilirubin Urine NEGATIVE NEGATIVE   Ketones, ur 15 (A) NEGATIVE mg/dL   Protein, ur 161100 (A) NEGATIVE mg/dL    Urobilinogen, UA 4.0 (H) 0.0 - 1.0 mg/dL   Nitrite POSITIVE (A) NEGATIVE   Leukocytes, UA LARGE (A) NEGATIVE  Urine microscopic-add on   Collection Time: 01/13/15 12:45 PM  Result Value Ref Range   Squamous Epithelial / LPF MANY (A) RARE   WBC, UA TOO NUMEROUS TO COUNT <3 WBC/hpf   Bacteria, UA MANY (A) RARE   Urine-Other FIELD OBSCURED BY WBC'S   Wet prep, genital   Collection Time: 01/13/15  2:10 PM  Result Value Ref Range   Yeast Wet Prep HPF POC NONE SEEN NONE SEEN   Trich, Wet Prep NONE SEEN NONE SEEN   Clue Cells Wet Prep HPF POC MANY (A) NONE SEEN   WBC, Wet Prep HPF POC FEW (A) NONE SEEN   Imaging Studies:  No results found.  Assessment: Maria Harvey is  29 y.o. G4P0030 at 208w5d presents with no evidence of ROM. Severe chronic constipation UTI  Plan: Discussed with Dr. Mora ApplPinn.  Will treat BV (flagyl 500mg  [id number]) Urine culture (none available in epic, but pt states a previous cx showed sensitivity to Macrobid) Milk and molasses enema (warm equal parts) for home use (pt was already dressed and declined hospital administration)  Harvey,Maria Avallone 6/5/20163:08 PM

## 2015-01-13 NOTE — Discharge Instructions (Signed)
2 cups milk and 2 cups of molasses (not maple syrup!) :  Warm them up in a sauce pan until the molasses melts, then let cool to room temperature to slightly warm.  Poor in enema bag and administer per directions on bag.

## 2015-01-15 LAB — URINE CULTURE: Special Requests: NORMAL

## 2015-01-17 ENCOUNTER — Telehealth: Payer: Self-pay | Admitting: Medical

## 2015-01-17 NOTE — Telephone Encounter (Signed)
Called patient about UTI noted on urine culture. Is currently taking macrobid. Advised to continue and complete course and increased PO hydration.   Marny Lowenstein, PA-C 01/17/2015 6:32 PM

## 2015-01-24 ENCOUNTER — Telehealth: Payer: Self-pay | Admitting: Physician Assistant

## 2015-01-24 NOTE — Telephone Encounter (Signed)
Spoke to Dr Chestine Spore (pts OB MD). Pt still constipated and it is hard for them to assess cervix due to Stool displacing uterus. Pr qwill be 39 weeks next Tues the 21 and they plan to induce then. Rev pt with Dr Leone Payor. Pt to use mineral oil enemas and tyap water enemas and continue miralax 4 caps bid. If this does not work, pt will be admitted to  [facility] Monday and GI will eval. May need gastrograffin to produce BM.

## 2015-01-28 ENCOUNTER — Inpatient Hospital Stay (HOSPITAL_COMMUNITY)
Admission: AD | Admit: 2015-01-28 | Discharge: 2015-01-31 | DRG: 775 | Disposition: A | Discharge status: Home / Self Care | Payer: Medicaid Other | Source: Ambulatory Visit | Attending: Obstetrics and Gynecology | Admitting: Obstetrics and Gynecology

## 2015-01-28 ENCOUNTER — Encounter (HOSPITAL_COMMUNITY): Payer: Self-pay | Admitting: *Deleted

## 2015-01-28 DIAGNOSIS — Z3483 Encounter for supervision of other normal pregnancy, third trimester: Secondary | ICD-10-CM | POA: Diagnosis present

## 2015-01-28 DIAGNOSIS — K5900 Constipation, unspecified: Secondary | ICD-10-CM | POA: Diagnosis present

## 2015-01-28 DIAGNOSIS — K5909 Other constipation: Secondary | ICD-10-CM | POA: Diagnosis present

## 2015-01-28 DIAGNOSIS — Z3A38 38 weeks gestation of pregnancy: Secondary | ICD-10-CM | POA: Diagnosis present

## 2015-01-28 LAB — CBC
HCT: 31.3 % — ABNORMAL LOW (ref 36.0–46.0)
Hemoglobin: 10.6 g/dL — ABNORMAL LOW (ref 12.0–15.0)
MCH: 31.4 pg (ref 26.0–34.0)
MCHC: 33.9 g/dL (ref 30.0–36.0)
MCV: 92.6 fL (ref 78.0–100.0)
Platelets: 313 10*3/uL (ref 150–400)
RBC: 3.38 MIL/uL — ABNORMAL LOW (ref 3.87–5.11)
RDW: 13.6 % (ref 11.5–15.5)
WBC: 13.3 10*3/uL — AB (ref 4.0–10.5)

## 2015-01-28 LAB — TYPE AND SCREEN
ABO/RH(D): O POS
ANTIBODY SCREEN: NEGATIVE

## 2015-01-28 LAB — ABO/RH: ABO/RH(D): O POS

## 2015-01-28 MED ORDER — OXYCODONE-ACETAMINOPHEN 5-325 MG PO TABS: 2.0000 | ORAL_TABLET | ORAL | Status: DC | PRN

## 2015-01-28 MED ORDER — MISOPROSTOL 25 MCG QUARTER TABLET
25.0000 ug | ORAL_TABLET | ORAL | Status: DC | PRN
  Administered 2015-01-29 (×2): 25 ug via VAGINAL
  Filled 2015-01-28: qty 0.25
  Filled 2015-01-28: qty 1
  Filled 2015-01-28: qty 0.25

## 2015-01-28 MED ORDER — LACTATED RINGERS IV SOLN
INTRAVENOUS | Status: DC
  Administered 2015-01-29 (×2): via INTRAVENOUS

## 2015-01-28 MED ORDER — LIDOCAINE HCL (PF) 1 % IJ SOLN
30.0000 mL | INTRAMUSCULAR | Status: DC | PRN
  Filled 2015-01-28: qty 30

## 2015-01-28 MED ORDER — OXYTOCIN 40 UNITS IN LACTATED RINGERS INFUSION - SIMPLE MED: 62.5000 mL/h | INTRAVENOUS | Status: DC

## 2015-01-28 MED ORDER — CITRIC ACID-SODIUM CITRATE 334-500 MG/5ML PO SOLN: 30.0000 mL | ORAL | Status: DC | PRN

## 2015-01-28 MED ORDER — DOCUSATE SODIUM 100 MG PO CAPS: 100.0000 mg | ORAL_CAPSULE | Freq: Every day | ORAL | Status: DC

## 2015-01-28 MED ORDER — ONDANSETRON HCL 4 MG/2ML IJ SOLN: 4.0000 mg | Freq: Four times a day (QID) | INTRAMUSCULAR | Status: DC | PRN

## 2015-01-28 MED ORDER — TERBUTALINE SULFATE 1 MG/ML IJ SOLN: 0.2500 mg | Freq: Once | INTRAMUSCULAR | Status: AC | PRN

## 2015-01-28 MED ORDER — DIPHENHYDRAMINE HCL 50 MG/ML IJ SOLN
12.5000 mg | Freq: Every evening | INTRAMUSCULAR | Status: DC | PRN
  Administered 2015-01-28: 12.5 mg via INTRAVENOUS
  Filled 2015-01-28: qty 1

## 2015-01-28 MED ORDER — LACTATED RINGERS IV SOLN
500.0000 mL | INTRAVENOUS | Status: DC | PRN
  Administered 2015-01-29: 500 mL via INTRAVENOUS

## 2015-01-28 MED ORDER — ACETAMINOPHEN 325 MG PO TABS: 650.0000 mg | ORAL_TABLET | ORAL | Status: DC | PRN

## 2015-01-28 MED ORDER — BUTORPHANOL TARTRATE 1 MG/ML IJ SOLN
1.0000 mg | INTRAMUSCULAR | Status: DC | PRN
  Administered 2015-01-29: 1 mg via INTRAVENOUS
  Filled 2015-01-28: qty 1

## 2015-01-28 MED ORDER — OXYTOCIN BOLUS FROM INFUSION: 500.0000 mL | INTRAVENOUS | Status: DC

## 2015-01-28 MED ORDER — OXYCODONE-ACETAMINOPHEN 5-325 MG PO TABS: 1.0000 | ORAL_TABLET | ORAL | Status: DC | PRN

## 2015-01-28 MED ORDER — SORBITOL 70 % SOLN
960.0000 mL | TOPICAL_OIL | Freq: Once | ORAL | Status: AC
  Administered 2015-01-28: 960 mL via RECTAL
  Filled 2015-01-28: qty 240

## 2015-01-28 MED ORDER — PRENATAL MULTIVITAMIN CH
1.0000 | ORAL_TABLET | Freq: Every day | ORAL | Status: DC
  Administered 2015-01-28: 1 via ORAL
  Filled 2015-01-28: qty 1

## 2015-01-28 MED ORDER — POLYETHYLENE GLYCOL 3350 17 G PO PACK
17.0000 g | PACK | Freq: Two times a day (BID) | ORAL | Status: DC
  Administered 2015-01-28 (×2): 17 g via ORAL
  Filled 2015-01-28 (×4): qty 1

## 2015-01-28 MED ORDER — CALCIUM CARBONATE ANTACID 500 MG PO CHEW: 2.0000 | CHEWABLE_TABLET | ORAL | Status: DC | PRN

## 2015-01-28 NOTE — Progress Notes (Signed)
Significant evacuation of stool after 3/4 of SMOG enema.   SVE: 1/60/-3  Fetal head now in central pelvis and cervix is easily accessible.  Will plan transfer to L&D at midnight for cervical ripening w cytotec

## 2015-01-28 NOTE — Progress Notes (Signed)
Dr. Chestine Spore @ bedside attempting to disimpact pt secondary hx chronic constipation.  MD made 2 attempts, states stool too soft, and will consult GI for further suggestions.  Pt tolerated well & agreeable to POC.

## 2015-01-28 NOTE — H&P (Signed)
29 y.o. G4P0030 @ [redacted]w[redacted]d presents with chronic constipation.  Long h/o chronic constipation since age 29.  Significant fecal load is obstructing vagina/cervix and preventing cervical exam and descent of fetal head.  She has almost daily small soft BMs.  Has failed outpatient management to including dietary changes, miralax, colace, lactulose, mag citrate, mineral oil, tap water enemas.  Admitted today for clean out in anticipation of IOL. Otherwise has good fetal movement and no bleeding.  Pregnancy is otherwise c/b recurrent E.coli UTIs  Past Medical History  Diagnosis Date  . Medical history non-contributory   . Constipation     severe. since childhood    Past Surgical History  Procedure Laterality Date  . No past surgeries    . Dilation and curettage of uterus      OB History  Gravida Para Term Preterm AB SAB TAB Ectopic Multiple Living  4    3 1 1 1   0    # Outcome Date GA Lbr Len/2nd Weight Sex Delivery Anes PTL Lv  4 Current           3 Ectopic           2 SAB           1 TAB               History   Social History  . Marital Status: Single    Spouse Name: N/A  . Number of Children: N/A  . Years of Education: N/A   Occupational History  . Not on file.   Social History Main Topics  . Smoking status: Never Smoker   . Smokeless tobacco: Not on file  . Alcohol Use: No  . Drug Use: No  . Sexual Activity: Yes    Birth Control/ Protection: None   Other Topics Concern  . Not on file   Social History Narrative   Review of patient's allergies indicates no known allergies.    Prenatal Transfer Tool  Maternal Diabetes: No Genetic Screening: Declined Maternal Ultrasounds/Referrals: Abnormal:  Findings:   Other:  Significant fecal load causing uterine deviation Fetal Ultrasounds or other Referrals:  Other:  6/3 EFW 6lb 3oz (55%) Maternal Substance Abuse:  No Significant Maternal Medications:  Meds include: Other:  Miralax, colace Significant Maternal Lab Results: Lab  values include: Group B Strep negative   Other PNC: uncomplicated.    Filed Vitals:   01/28/15 1102  BP: 121/81  Pulse: 101  Temp: 97.7 F (36.5 C)  Resp: 20     General:  NAD Abdomen:  soft, gravid, EFW 6-6.5# Ex:  no edema SVE:  Significant stool burden occluding vagina, preventing examination of cervix.  DRE: large volume of soft stool, unable to appreciate any hard stool.  Some stool removed by manual disimpaction, bt patient not tolerant of exam.  Unable to appreciate superior aspect of impaction FHTs:  140s   Vtx by [facility] in office today  A/P   29 y.o. [redacted]w[redacted]d  G4P0030 presents with chronic constipation. T&S, CBC NST on admit Clear liquid diet Previously seen by [facility]. Discussed with Dr. Leone Payor.  Will start w SMOG enema, await further recs by GI.   FSR/vtx/gbs neg [facility]FFEL [employer]

## 2015-01-28 NOTE — Consult Note (Signed)
Consultation  Referring Provider:  Marlow Baars, MD    Primary Care Physician:  Coralyn Mark, MD Primary Gastroenterologist:  none       Reason for Consultation:   constipation           HPI:   Maria Harvey is a 29 y.o. female who is one day shy of [redacted] weeks gestation. She has a longstanding history of constipation managed with daily Miralax. Patient reports being hospitalized a few times with such severe constipation that she required NGT / bowel purge. A couple of months ago the constipation escalated. Prenatal vitamins were discontinued, Miralax increased to 3-4 times a day and lactulose added. Though having BMs several times a week, the volume of stool has been negligible. The massive amount of colonic stool in interfering with cervical exam and descent of fetal head. If bowel can be evacuated patient may be induced tomorrow.   Past Medical History  Diagnosis Date  . Medical history non-contributory   . Constipation     severe. since childhood    Past Surgical History  Procedure Laterality Date  . No past surgeries    . Dilation and curettage of uterus      Family History  Problem Relation Age of Onset  . Diabetes Father   . Colon cancer Neg Hx   . Esophageal cancer Neg Hx   . Liver disease Neg Hx   . Kidney disease Neg Hx   . Heart disease Neg Hx      History  Substance Use Topics  . Smoking status: Never Smoker   . Smokeless tobacco: Not on file  . Alcohol Use: No    Prior to Admission medications   Medication Sig Start Date End Date Taking? Authorizing Provider  nitrofurantoin, macrocrystal-monohydrate, (MACROBID) 100 MG capsule Take 100 mg by mouth 2 (two) times daily. Prophylaxis regimen   Yes Historical Provider, MD  Polyethylene Glycol 3350 (MIRALAX PO) Take 17 g by mouth daily.    Yes Historical Provider, MD    Current Facility-Administered Medications  Medication Dose Route Frequency Provider Last Rate Last Dose  . acetaminophen  (TYLENOL) tablet 650 mg  650 mg Oral Q4H PRN Marlow Baars, MD      . calcium carbonate (TUMS - dosed in mg elemental calcium) chewable tablet 400 mg of elemental calcium  2 tablet Oral Q4H PRN Marlow Baars, MD      . docusate sodium (COLACE) capsule 100 mg  100 mg Oral Daily Marlow Baars, MD      . prenatal multivitamin tablet 1 tablet  1 tablet Oral Q1200 Marlow Baars, MD   1 tablet at 01/28/15 1259  . sorbitol, milk of mag, mineral oil, glycerin (SMOG) enema  960 mL Rectal Once Marlow Baars, MD        Allergies as of 01/28/2015  . (No Known Allergies)     Review of Systems:    As per HPI, otherwise negative    Physical Exam:  Vital signs in last 24 hours: Temp:  [97.3 F (36.3 C)-97.7 F (36.5 C)] 97.3 F (36.3 C) (06/20 1301) Pulse Rate:  [101-103] 103 (06/20 1301) Resp:  [20] 20 (06/20 1301) BP: (121-124)/(77-81) 124/77 mmHg (06/20 1301) Weight:  [189 lb (85.73 kg)] 189 lb (85.73 kg) (06/20 1102)   General:   Pleasant black in NAD Head:  Normocephalic and atraumatic. Eyes:   No icterus.   Conjunctiva pink. Ears:  Normal auditory acuity. Neck:  Supple Lungs:  Respirations even and unlabored. Lungs clear to auscultation bilaterally.   No wheezes, crackles, or rhonchi.  Heart:  Regular rate and rhythm Abdomen:  Soft, distended, baby's head in lower abdomen.  Normal bowel sounds. Rectal:  Large amount of soft, light brown stool in vaults.   Msk:  Symmetrical without gross deformities.  Extremities:  Without edema. Neurologic:  Alert and  oriented x4;  grossly normal neurologically. Skin:  Intact without significant lesions or rashes. Psych:  Alert and cooperative. Normal affect.  LAB RESULTS:  Recent Labs  01/28/15 1145  WBC 13.3*  HGB 10.6*  HCT 31.3*  PLT 313    PREVIOUS ENDOSCOPIES:            none   Impression / Plan:   29 year old female, [redacted] weeks gestation tomorrow, with acute on chronic constipation. Severe constipation interfering with descent of  fetal head. Constipation refractory to Miralax, lactulose, colace, tap water enemas, and mag citrate. We have ordered a SMOG enema which is being given right now. If no results she may need gastrografin enema.   Thanks  Addendum: Patient tolerated 3/4 of enema. Following that she expelled a massive amount of soft brown stool. If still unable to do cervical exam then could repeat SMOG though I suspect she will be okay from here. Would continue BID MIralax.    LOS: 0 days   Willette Cluster  01/28/2015, 2:49 PM   Agree with above note. She had successful defecation after SMOG enema.  We will sign off.  She will probably need bowel regimen with MiraLAx after delivery.  Call us if any ?  Iva Boop, MD, [facility] [phone] (pager) 01/28/2015 9:12 PM

## 2015-01-29 ENCOUNTER — Inpatient Hospital Stay (HOSPITAL_COMMUNITY): Payer: Medicaid Other | Admitting: Anesthesiology

## 2015-01-29 ENCOUNTER — Encounter (HOSPITAL_COMMUNITY): Payer: Self-pay | Admitting: Anesthesiology

## 2015-01-29 LAB — RPR: RPR Ser Ql: NONREACTIVE

## 2015-01-29 MED ORDER — MISOPROSTOL 200 MCG PO TABS
ORAL_TABLET | ORAL | Status: AC
  Administered 2015-01-29: 800 ug
  Filled 2015-01-29: qty 4

## 2015-01-29 MED ORDER — LANOLIN HYDROUS EX OINT: TOPICAL_OINTMENT | CUTANEOUS | Status: DC | PRN

## 2015-01-29 MED ORDER — OXYTOCIN 40 UNITS IN LACTATED RINGERS INFUSION - SIMPLE MED
1.0000 m[IU]/min | INTRAVENOUS | Status: DC
  Administered 2015-01-29: 2 m[IU]/min via INTRAVENOUS
  Filled 2015-01-29: qty 1000

## 2015-01-29 MED ORDER — SODIUM CHLORIDE 0.9 % IJ SOLN: 3.0000 mL | INTRAMUSCULAR | Status: DC | PRN

## 2015-01-29 MED ORDER — METHYLERGONOVINE MALEATE 0.2 MG/ML IJ SOLN: 0.2000 mg | INTRAMUSCULAR | Status: DC | PRN

## 2015-01-29 MED ORDER — SENNOSIDES-DOCUSATE SODIUM 8.6-50 MG PO TABS
2.0000 | ORAL_TABLET | ORAL | Status: DC
Start: 2015-01-30 — End: 2015-01-31
  Administered 2015-01-29 – 2015-01-31 (×2): 2 via ORAL
  Filled 2015-01-29 (×2): qty 2

## 2015-01-29 MED ORDER — FERROUS SULFATE 325 (65 FE) MG PO TABS
325.0000 mg | ORAL_TABLET | Freq: Two times a day (BID) | ORAL | Status: DC
  Administered 2015-01-30 – 2015-01-31 (×3): 325 mg via ORAL
  Filled 2015-01-29 (×3): qty 1

## 2015-01-29 MED ORDER — ONDANSETRON HCL 4 MG/2ML IJ SOLN: 4.0000 mg | INTRAMUSCULAR | Status: DC | PRN

## 2015-01-29 MED ORDER — DIPHENHYDRAMINE HCL 25 MG PO CAPS: 25.0000 mg | ORAL_CAPSULE | Freq: Four times a day (QID) | ORAL | Status: DC | PRN

## 2015-01-29 MED ORDER — ACETAMINOPHEN 325 MG PO TABS
650.0000 mg | ORAL_TABLET | ORAL | Status: DC | PRN
  Filled 2015-01-29: qty 2

## 2015-01-29 MED ORDER — DIPHENHYDRAMINE HCL 50 MG/ML IJ SOLN: 12.5000 mg | INTRAMUSCULAR | Status: DC | PRN

## 2015-01-29 MED ORDER — PHENYLEPHRINE 40 MCG/ML (10ML) SYRINGE FOR IV PUSH (FOR BLOOD PRESSURE SUPPORT)
80.0000 ug | PREFILLED_SYRINGE | INTRAVENOUS | Status: DC | PRN
  Filled 2015-01-29: qty 20
  Filled 2015-01-29: qty 2

## 2015-01-29 MED ORDER — DIBUCAINE 1 % RE OINT: 1.0000 "application " | TOPICAL_OINTMENT | RECTAL | Status: DC | PRN

## 2015-01-29 MED ORDER — SODIUM CHLORIDE 0.9 % IV SOLN: 250.0000 mL | INTRAVENOUS | Status: DC | PRN

## 2015-01-29 MED ORDER — MEASLES, MUMPS & RUBELLA VAC ~~LOC~~ INJ: 0.5000 mL | INJECTION | Freq: Once | SUBCUTANEOUS | Status: DC

## 2015-01-29 MED ORDER — SIMETHICONE 80 MG PO CHEW: 80.0000 mg | CHEWABLE_TABLET | ORAL | Status: DC | PRN

## 2015-01-29 MED ORDER — POLYETHYLENE GLYCOL 3350 17 GM/SCOOP PO POWD: 1.0000 | Freq: Every day | ORAL | Status: DC

## 2015-01-29 MED ORDER — SODIUM CHLORIDE 0.9 % IJ SOLN: 3.0000 mL | Freq: Two times a day (BID) | INTRAMUSCULAR | Status: DC

## 2015-01-29 MED ORDER — EPHEDRINE 5 MG/ML INJ
10.0000 mg | INTRAVENOUS | Status: DC | PRN
  Filled 2015-01-29: qty 2

## 2015-01-29 MED ORDER — TETANUS-DIPHTH-ACELL PERTUSSIS 5-2.5-18.5 LF-MCG/0.5 IM SUSP: 0.5000 mL | Freq: Once | INTRAMUSCULAR | Status: DC

## 2015-01-29 MED ORDER — OXYCODONE-ACETAMINOPHEN 5-325 MG PO TABS
1.0000 | ORAL_TABLET | ORAL | Status: DC | PRN
  Administered 2015-01-30: 1 via ORAL
  Filled 2015-01-29: qty 1

## 2015-01-29 MED ORDER — MAGNESIUM HYDROXIDE 400 MG/5ML PO SUSP: 30.0000 mL | ORAL | Status: DC | PRN

## 2015-01-29 MED ORDER — POLYETHYLENE GLYCOL 3350 17 G PO PACK
17.0000 g | PACK | Freq: Every day | ORAL | Status: DC
  Administered 2015-01-30: 17 g via ORAL
  Filled 2015-01-29 (×2): qty 1

## 2015-01-29 MED ORDER — IBUPROFEN 800 MG PO TABS
800.0000 mg | ORAL_TABLET | Freq: Three times a day (TID) | ORAL | Status: DC
  Administered 2015-01-29 – 2015-01-31 (×5): 800 mg via ORAL
  Filled 2015-01-29 (×5): qty 1

## 2015-01-29 MED ORDER — IBUPROFEN 800 MG PO TABS
800.0000 mg | ORAL_TABLET | Freq: Once | ORAL | Status: AC
  Administered 2015-01-29: 800 mg via ORAL
  Filled 2015-01-29: qty 1

## 2015-01-29 MED ORDER — FENTANYL 2.5 MCG/ML BUPIVACAINE 1/10 % EPIDURAL INFUSION (WH - ANES)
14.0000 mL/h | INTRAMUSCULAR | Status: DC | PRN
  Administered 2015-01-29 (×3): 14 mL/h via EPIDURAL
  Filled 2015-01-29 (×2): qty 125

## 2015-01-29 MED ORDER — BENZOCAINE-MENTHOL 20-0.5 % EX AERO: 1.0000 "application " | INHALATION_SPRAY | CUTANEOUS | Status: DC | PRN

## 2015-01-29 MED ORDER — OXYCODONE-ACETAMINOPHEN 5-325 MG PO TABS: 2.0000 | ORAL_TABLET | ORAL | Status: DC | PRN

## 2015-01-29 MED ORDER — ZOLPIDEM TARTRATE 5 MG PO TABS: 5.0000 mg | ORAL_TABLET | Freq: Every evening | ORAL | Status: DC | PRN

## 2015-01-29 MED ORDER — METHYLERGONOVINE MALEATE 0.2 MG PO TABS: 0.2000 mg | ORAL_TABLET | ORAL | Status: DC | PRN

## 2015-01-29 MED ORDER — PRENATAL MULTIVITAMIN CH
1.0000 | ORAL_TABLET | Freq: Every day | ORAL | Status: DC
Start: 2015-01-30 — End: 2015-01-31
  Administered 2015-01-30: 1 via ORAL
  Filled 2015-01-29: qty 1

## 2015-01-29 MED ORDER — WITCH HAZEL-GLYCERIN EX PADS: 1.0000 "application " | MEDICATED_PAD | CUTANEOUS | Status: DC | PRN

## 2015-01-29 MED ORDER — ONDANSETRON HCL 4 MG PO TABS
4.0000 mg | ORAL_TABLET | ORAL | Status: DC | PRN
Start: 2015-01-29 — End: 2015-01-31

## 2015-01-29 MED ORDER — LIDOCAINE HCL (PF) 1 % IJ SOLN
INTRAMUSCULAR | Status: DC | PRN
  Administered 2015-01-29 (×2): 8 mL

## 2015-01-29 NOTE — Anesthesia Preprocedure Evaluation (Signed)
Anesthesia Evaluation  Patient identified by MRN, date of birth, ID band Patient awake    Reviewed: Allergy & Precautions, H&P , NPO status , Patient's Chart, lab work & pertinent test results  Airway Mallampati: II  TM Distance: >3 FB Neck ROM: full    Dental no notable dental hx.    Pulmonary neg pulmonary ROS,    Pulmonary exam normal       Cardiovascular negative cardio ROS Normal cardiovascular exam    Neuro/Psych negative neurological ROS  negative psych ROS   GI/Hepatic negative GI ROS, Neg liver ROS,   Endo/Other  negative endocrine ROS  Renal/GU negative Renal ROS     Musculoskeletal   Abdominal Normal abdominal exam  (+)   Peds  Hematology negative hematology ROS (+)   Anesthesia Other Findings   Reproductive/Obstetrics (+) Pregnancy                             Anesthesia Physical Anesthesia Plan  ASA: II  Anesthesia Plan: Epidural   Post-op Pain Management:    Induction:   Airway Management Planned:   Additional Equipment:   Intra-op Plan:   Post-operative Plan:   Informed Consent: I have reviewed the patients History and Physical, chart, labs and discussed the procedure including the risks, benefits and alternatives for the proposed anesthesia with the patient or authorized representative who has indicated his/her understanding and acceptance.     Plan Discussed with:   Anesthesia Plan Comments:         Anesthesia Quick Evaluation  

## 2015-01-29 NOTE — Anesthesia Procedure Notes (Signed)
Epidural Patient location during procedure: OB Start time: 01/29/2015 10:36 AM End time: 01/29/2015 10:40 AM  Staffing Anesthesiologist: Leilani Able Performed by: anesthesiologist   Preanesthetic Checklist Completed: patient identified, surgical consent, pre-op evaluation, timeout performed, IV checked, risks and benefits discussed and monitors and equipment checked  Epidural Patient position: sitting Prep: site prepped and draped and DuraPrep Patient monitoring: continuous pulse ox and blood pressure Approach: midline Location: L3-L4 Injection technique: LOR air  Needle:  Needle type: Tuohy  Needle gauge: 17 G Needle length: 9 cm and 9 Needle insertion depth: 5 cm cm Catheter type: closed end flexible Catheter size: 19 Gauge Catheter at skin depth: 10 cm Test dose: negative and Other  Assessment Sensory level: T9 Events: blood not aspirated, injection not painful, no injection resistance, negative IV test and no paresthesia  Additional Notes Reason for block:procedure for pain

## 2015-01-29 NOTE — Progress Notes (Signed)
Pt s/p two cytoec. Filed Vitals:   01/29/15 0412  BP: 133/81  Pulse: 87  Temp: 98.4 F (36.9 C)  Resp: 18   FHTs 130s,gSTV NST R Toco q 5-10  SVE  3/80/-1, AROM clear.  Begin Pitocin now.

## 2015-01-30 LAB — CBC
HCT: 28 % — ABNORMAL LOW (ref 36.0–46.0)
HEMOGLOBIN: 9.5 g/dL — AB (ref 12.0–15.0)
MCH: 31.3 pg (ref 26.0–34.0)
MCHC: 33.9 g/dL (ref 30.0–36.0)
MCV: 92.1 fL (ref 78.0–100.0)
Platelets: 258 10*3/uL (ref 150–400)
RBC: 3.04 MIL/uL — AB (ref 3.87–5.11)
RDW: 13.6 % (ref 11.5–15.5)
WBC: 20.5 10*3/uL — ABNORMAL HIGH (ref 4.0–10.5)

## 2015-01-30 NOTE — Progress Notes (Signed)
Post Partum Day 1 Subjective: no complaints, up ad lib, voiding, tolerating PO and + flatus  Objective: Blood pressure 124/78, pulse 87, temperature 98.4 F (36.9 C), temperature source Oral, resp. rate 18, height 5\' 11"  (1.803 m), weight 85.73 kg (189 lb), last menstrual period 06/07/2014, SpO2 100 %, unknown if currently breastfeeding.  Physical Exam:  General: alert, cooperative and appears stated age Lochia: appropriate Uterine Fundus: firm   Recent Labs  01/28/15 1145 01/30/15 0550  HGB 10.6* 9.5*  HCT 31.3* 28.0*    Assessment/Plan: Plan for discharge tomorrow  Patient bottle feeding   LOS: 2 days   Maria Harvey H. 01/30/2015, 8:44 AM

## 2015-01-30 NOTE — Progress Notes (Signed)
UR chart review completed.  

## 2015-01-30 NOTE — Anesthesia Postprocedure Evaluation (Signed)
  Anesthesia Post-op Note  Patient: Maria Harvey  Procedure(s) Performed: * No procedures listed *  Patient Location: Mother/Baby  Anesthesia Type:Epidural  Level of Consciousness: awake, oriented and patient cooperative  Airway and Oxygen Therapy: Patient Spontanous Breathing  Post-op Pain: none  Post-op Assessment: Post-op Vital signs reviewed, Patient's Cardiovascular Status Stable, Respiratory Function Stable, Patent Airway, No signs of Nausea or vomiting, Adequate PO intake, Pain level controlled, No headache, No backache and Patient able to bend at knees              Post-op Vital Signs: Reviewed and stable  Last Vitals:  Filed Vitals:   01/30/15 0215  BP: 124/78  Pulse: 87  Temp: 36.9 C  Resp: 18    Complications: No apparent anesthesia complications

## 2015-01-31 MED ORDER — IBUPROFEN 800 MG PO TABS: 800.0000 mg | ORAL_TABLET | Freq: Three times a day (TID) | ORAL | Status: DC

## 2015-01-31 MED ORDER — OXYCODONE-ACETAMINOPHEN 5-325 MG PO TABS: 1.0000 | ORAL_TABLET | Freq: Four times a day (QID) | ORAL | Status: DC | PRN

## 2015-01-31 NOTE — Discharge Summary (Signed)
Obstetric Discharge Summary Reason for Admission: induction of labor Prenatal Procedures: NST Intrapartum Procedures: spontaneous vaginal delivery Postpartum Procedures: none Complications-Operative and Postpartum: 2nd degree perineal laceration HEMOGLOBIN  Date Value Ref Range Status  01/30/2015 9.5* 12.0 - 15.0 g/dL Final   HCT  Date Value Ref Range Status  01/30/2015 28.0* 36.0 - 46.0 % Final    Physical Exam:  General: alert, cooperative, appears stated age and no distress Lochia: appropriate Uterine Fundus: firm perineum: healing well, no significant drainage, no dehiscence, no significant erythema DVT Evaluation: No evidence of DVT seen on physical exam. Negative Homan's sign. No cords or calf tenderness.  Discharge Diagnoses: Term Pregnancy-delivered  Discharge Information: Date: 01/31/2015 Activity: pelvic rest Diet: routine Medications: PNV, Ibuprofen, Colace and Percocet Condition: stable Instructions: refer to practice specific booklet Discharge to: home   Newborn Data: Live born female  Birth Weight: 6 lb 15.3 oz (3155 g) APGAR: 9, 9  Home with mother.  Essie Hart STACIA 01/31/2015, 9:49 AM

## 2015-01-31 NOTE — Progress Notes (Signed)
Post Partum Day 2 Subjective: no complaints, up ad lib, voiding, tolerating PO, + flatus and breast feeding.  Mother and baby are bonding well  Objective: Blood pressure 118/76, pulse 93, temperature 98 F (36.7 C), temperature source Oral, resp. rate 18, height 5\' 11"  (1.803 m), weight 85.73 kg (189 lb), last menstrual period 06/07/2014, SpO2 100 %, unknown if currently breastfeeding.  Physical Exam:  General: alert, cooperative, appears stated age and no distress Lochia: appropriate Uterine Fundus: firm perineum: healing well, no significant drainage, no dehiscence, no significant erythema DVT Evaluation: No evidence of DVT seen on physical exam. Negative Homan's sign. No cords or calf tenderness.   Recent Labs  01/28/15 1145 01/30/15 0550  HGB 10.6* 9.5*  HCT 31.3* 28.0*    Assessment/Plan: Discharge home and Breastfeeding   LOS: 3 days   Kirill Chatterjee STACIA 01/31/2015, 9:46 AM

## 2015-04-02 ENCOUNTER — Encounter (HOSPITAL_BASED_OUTPATIENT_CLINIC_OR_DEPARTMENT_OTHER): Payer: Self-pay | Admitting: Emergency Medicine

## 2015-04-02 ENCOUNTER — Emergency Department (HOSPITAL_BASED_OUTPATIENT_CLINIC_OR_DEPARTMENT_OTHER)
Admission: EM | Admit: 2015-04-02 | Discharge: 2015-04-02 | Disposition: A | Discharge status: Home / Self Care | Payer: Medicaid Other | Attending: Emergency Medicine | Admitting: Emergency Medicine

## 2015-04-02 DIAGNOSIS — M779 Enthesopathy, unspecified: Secondary | ICD-10-CM | POA: Insufficient documentation

## 2015-04-02 DIAGNOSIS — Z79899 Other long term (current) drug therapy: Secondary | ICD-10-CM | POA: Diagnosis not present

## 2015-04-02 DIAGNOSIS — K5900 Constipation, unspecified: Secondary | ICD-10-CM | POA: Insufficient documentation

## 2015-04-02 DIAGNOSIS — M79631 Pain in right forearm: Secondary | ICD-10-CM | POA: Diagnosis present

## 2015-04-02 NOTE — ED Notes (Signed)
Pt states she hurt right arm two months ago moving a couch and just not getting better

## 2015-04-02 NOTE — ED Provider Notes (Signed)
CSN: [id number]     Arrival date & time 04/02/15  1037 History   First MD Initiated Contact with Patient 04/02/15 1044     Chief Complaint  Patient presents with  . Arm Pain     (Consider location/radiation/quality/duration/timing/severity/associated sxs/prior Treatment) Patient is a 29 y.o. female presenting with arm pain.  Arm Pain This is a new problem. Episode onset: 2 months ago. The problem occurs constantly. The problem has not changed since onset.Associated symptoms include headaches. Pertinent negatives include no chest pain, no abdominal pain and no shortness of breath. Exacerbated by: use of R arm. Nothing relieves the symptoms. She has tried nothing for the symptoms.    Past Medical History  Diagnosis Date  . Medical history non-contributory   . Constipation     severe. since childhood   Past Surgical History  Procedure Laterality Date  . No past surgeries    . Dilation and curettage of uterus     Family History  Problem Relation Age of Onset  . Diabetes Father   . Colon cancer Neg Hx   . Esophageal cancer Neg Hx   . Liver disease Neg Hx   . Kidney disease Neg Hx   . Heart disease Neg Hx    Social History  Substance Use Topics  . Smoking status: Never Smoker   . Smokeless tobacco: None  . Alcohol Use: No   OB History    Gravida Para Term Preterm AB TAB SAB Ectopic Multiple Living   0 1     Review of Systems  Respiratory: Negative for shortness of breath.   Cardiovascular: Negative for chest pain.  Gastrointestinal: Negative for abdominal pain.  Neurological: Positive for headaches.  All other systems reviewed and are negative.     Allergies  Review of patient's allergies indicates no known allergies.  Home Medications   Prior to Admission medications   Medication Sig Start Date End Date Taking? Authorizing Provider  ibuprofen (ADVIL,MOTRIN) 800 MG tablet Take 1 tablet (800 mg total) by mouth every 8 (eight) hours. 01/31/15    Essie Hart, MD  oxyCODONE-acetaminophen (PERCOCET/ROXICET) 5-325 MG per tablet Take 1-2 tablets by mouth every 6 (six) hours as needed (for pain scale 4-7). 01/31/15   Essie Hart, MD  Polyethylene Glycol 3350 (MIRALAX PO) Take 17 g by mouth daily.     Historical Provider, MD   BP 126/74 mmHg  Pulse 81  Temp(Src) 98.3 F (36.8 C) (Oral)  Resp 18  Ht  (1.803 m)  Wt 183 lb 6.4 oz (83.19 kg)  BMI 25.59 kg/m2  SpO2 99% Physical Exam  Constitutional: She is oriented to person, place, and time. She appears well-developed and well-nourished.  HENT:  Head: Normocephalic and atraumatic.  Right Ear: External ear normal.  Left Ear: External ear normal.  Eyes: Conjunctivae and EOM are normal. Pupils are equal, round, and reactive to light.  Neck: Normal range of motion. Neck supple.  Cardiovascular: Normal rate, regular rhythm, normal heart sounds and intact distal pulses.   Pulmonary/Chest: Effort normal and breath sounds normal.  Abdominal: Soft. Bowel sounds are normal. There is no tenderness.  Musculoskeletal: Normal range of motion.  Tenderness and irritation of R distal radial forearm over area of abductor pollicis longus, NV intact  Neurological: She is alert and oriented to person, place, and time.  Skin: Skin is warm and dry.  Vitals reviewed.   ED Course  Procedures (including critical care time)  Labs Review Labs Reviewed - No data to display  Imaging Review No results found. I have personally reviewed and evaluated these images and lab results as part of my medical decision-making.   EKG Interpretation None      MDM   Final diagnoses:  Tendonitis    29 y.o. female without pertinent PMH presents with pain, swelling of R forearm and pain x 2 months after lifting furniture.  Exam consistent with tendinitis, no swelling of entire compartment or other concerning exam features of VT, no tenderness proximally.  Discharged home in stable condition to follow-up with PCP.     I have reviewed all laboratory and imaging studies if ordered as above  1. Tendonitis         Mirian Mo, MD 04/02/15 1106

## 2015-04-02 NOTE — Discharge Instructions (Signed)

## 2016-05-16 ENCOUNTER — Encounter (HOSPITAL_BASED_OUTPATIENT_CLINIC_OR_DEPARTMENT_OTHER): Payer: Self-pay | Admitting: *Deleted

## 2016-05-16 ENCOUNTER — Emergency Department (HOSPITAL_BASED_OUTPATIENT_CLINIC_OR_DEPARTMENT_OTHER)
Admission: EM | Admit: 2016-05-16 | Discharge: 2016-05-16 | Disposition: A | Discharge status: Home / Self Care | Payer: Medicaid Other | Attending: Dermatology | Admitting: Dermatology

## 2016-05-16 DIAGNOSIS — M542 Cervicalgia: Secondary | ICD-10-CM | POA: Insufficient documentation

## 2016-05-16 DIAGNOSIS — Y9389 Activity, other specified: Secondary | ICD-10-CM | POA: Diagnosis not present

## 2016-05-16 DIAGNOSIS — Y999 Unspecified external cause status: Secondary | ICD-10-CM | POA: Diagnosis not present

## 2016-05-16 DIAGNOSIS — Y92410 Unspecified street and highway as the place of occurrence of the external cause: Secondary | ICD-10-CM | POA: Diagnosis not present

## 2016-05-16 DIAGNOSIS — Z5321 Procedure and treatment not carried out due to patient leaving prior to being seen by health care provider: Secondary | ICD-10-CM | POA: Diagnosis not present

## 2016-05-16 DIAGNOSIS — M545 Low back pain: Secondary | ICD-10-CM | POA: Insufficient documentation
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
## 2016-11-06 ENCOUNTER — Encounter (HOSPITAL_COMMUNITY): Payer: Self-pay

## 2016-11-06 ENCOUNTER — Inpatient Hospital Stay (HOSPITAL_COMMUNITY)
Admission: AD | Admit: 2016-11-06 | Discharge: 2016-11-06 | Disposition: A | Discharge status: Home / Self Care | Payer: Medicaid Other | Source: Ambulatory Visit | Attending: Obstetrics & Gynecology | Admitting: Obstetrics & Gynecology

## 2016-11-06 ENCOUNTER — Inpatient Hospital Stay (HOSPITAL_COMMUNITY): Payer: Medicaid Other

## 2016-11-06 DIAGNOSIS — IMO0001 Reserved for inherently not codable concepts without codable children: Secondary | ICD-10-CM

## 2016-11-06 DIAGNOSIS — O418X10 Other specified disorders of amniotic fluid and membranes, first trimester, not applicable or unspecified: Secondary | ICD-10-CM

## 2016-11-06 DIAGNOSIS — O99612 Diseases of the digestive system complicating pregnancy, second trimester: Secondary | ICD-10-CM | POA: Insufficient documentation

## 2016-11-06 DIAGNOSIS — O208 Other hemorrhage in early pregnancy: Secondary | ICD-10-CM | POA: Insufficient documentation

## 2016-11-06 DIAGNOSIS — O4692 Antepartum hemorrhage, unspecified, second trimester: Secondary | ICD-10-CM

## 2016-11-06 DIAGNOSIS — Z3A18 18 weeks gestation of pregnancy: Secondary | ICD-10-CM | POA: Insufficient documentation

## 2016-11-06 DIAGNOSIS — K5902 Outlet dysfunction constipation: Secondary | ICD-10-CM

## 2016-11-06 DIAGNOSIS — O468X1 Other antepartum hemorrhage, first trimester: Secondary | ICD-10-CM

## 2016-11-06 DIAGNOSIS — Z3687 Encounter for antenatal screening for uncertain dates: Secondary | ICD-10-CM

## 2016-11-06 DIAGNOSIS — O209 Hemorrhage in early pregnancy, unspecified: Secondary | ICD-10-CM

## 2016-11-06 HISTORY — DX: Anemia, unspecified: D64.9

## 2016-11-06 HISTORY — DX: Noninfective gastroenteritis and colitis, unspecified: K52.9

## 2016-11-06 LAB — URINALYSIS, ROUTINE W REFLEX MICROSCOPIC
Bilirubin Urine: NEGATIVE
Glucose, UA: NEGATIVE mg/dL
HGB URINE DIPSTICK: NEGATIVE
Ketones, ur: NEGATIVE mg/dL
Nitrite: POSITIVE — AB
PROTEIN: NEGATIVE mg/dL
Specific Gravity, Urine: 1.025 (ref 1.005–1.030)
pH: 5 (ref 5.0–8.0)

## 2016-11-06 LAB — CBC
HCT: 32.3 % — ABNORMAL LOW (ref 36.0–46.0)
Hemoglobin: 11.1 g/dL — ABNORMAL LOW (ref 12.0–15.0)
MCH: 31.6 pg (ref 26.0–34.0)
MCHC: 34.4 g/dL (ref 30.0–36.0)
MCV: 92 fL (ref 78.0–100.0)
PLATELETS: 299 10*3/uL (ref 150–400)
RBC: 3.51 MIL/uL — AB (ref 3.87–5.11)
RDW: 14.6 % (ref 11.5–15.5)
WBC: 11.5 10*3/uL — ABNORMAL HIGH (ref 4.0–10.5)

## 2016-11-06 LAB — WET PREP, GENITAL
Sperm: NONE SEEN
TRICH WET PREP: NONE SEEN
YEAST WET PREP: NONE SEEN

## 2016-11-06 LAB — ABO/RH: ABO/RH(D): O POS

## 2016-11-06 LAB — POCT PREGNANCY, URINE: Preg Test, Ur: POSITIVE — AB

## 2016-11-06 IMAGING — US US OB COMP LESS 14 WK
1 series · 15 of 28 positions shown · non-contrast
Comparison: None for this gestation

CLINICAL DATA: Vaginal bleeding today, history of colitis, pregnant

EXAM:
OBSTETRIC <14 WK ULTRASOUND
TECHNIQUE: Transabdominal ultrasound was performed for evaluation of the
gestation as well as the maternal uterus and adnexal regions.

[Series 1: us ob comp less 14 wk · 32 acquisitions, 15 frames shown]
[im 1/32]
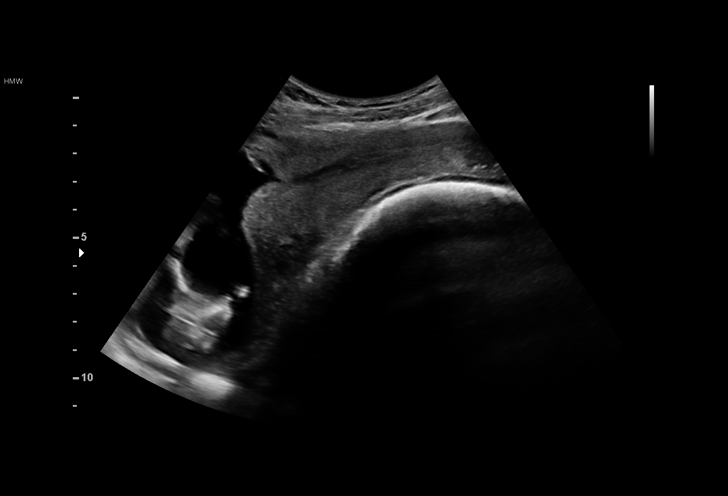
[im 3/32]
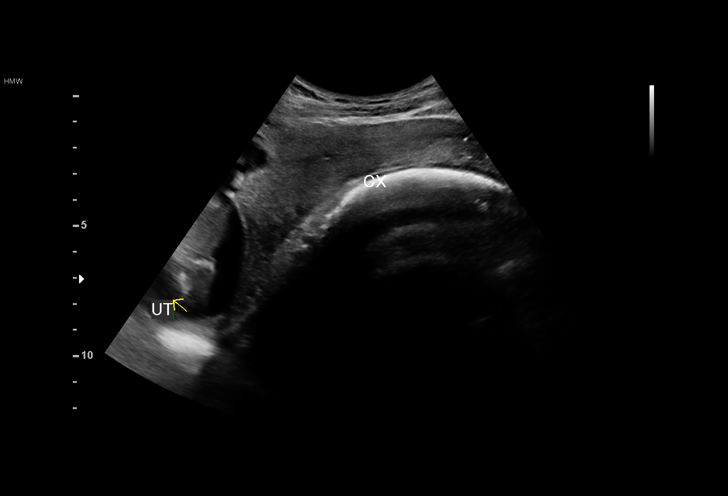
[im 5/32]
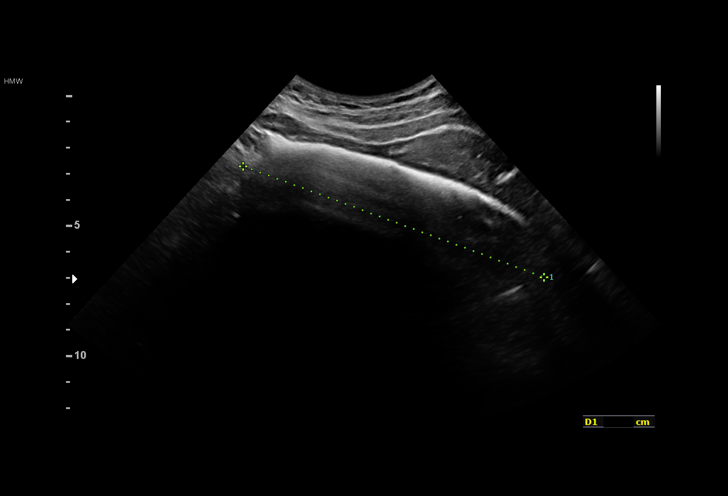
[im 7/32]
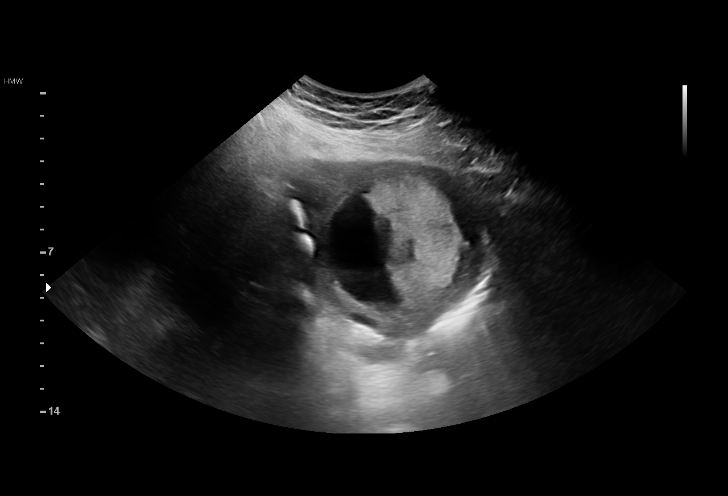
[im 10/32]
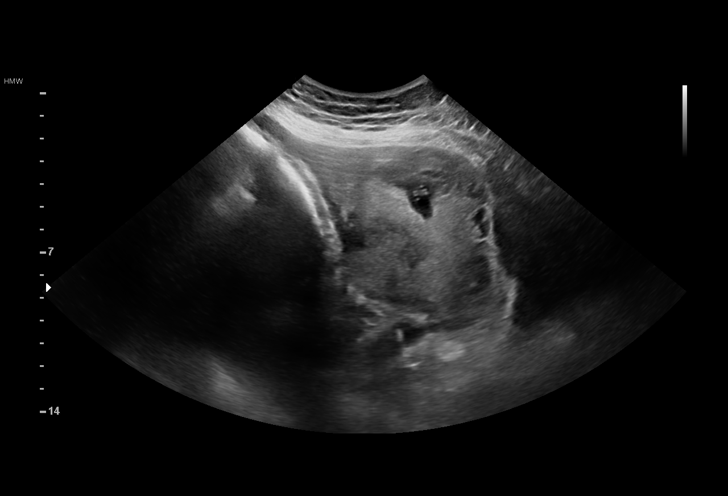
[im 12/32]
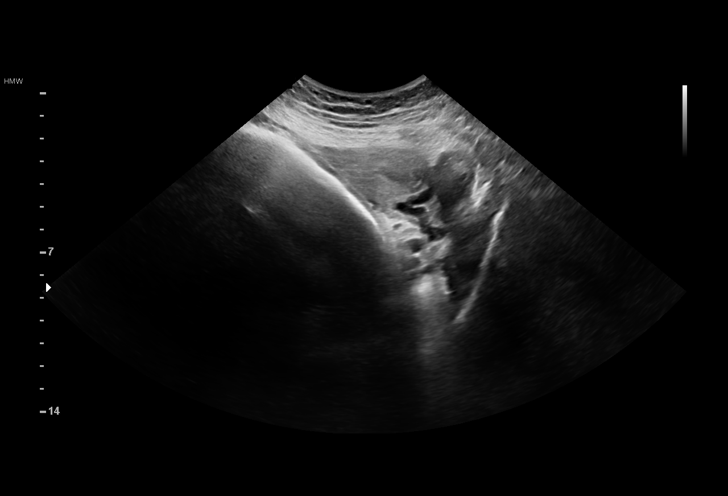
[im 14/32]
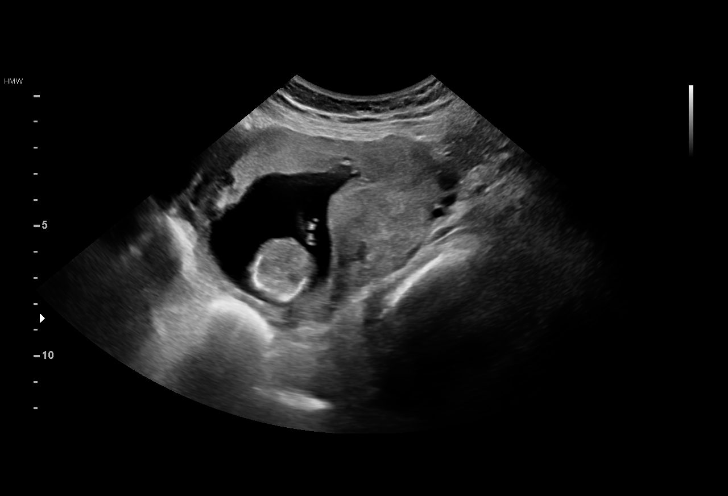
[im 17/32]
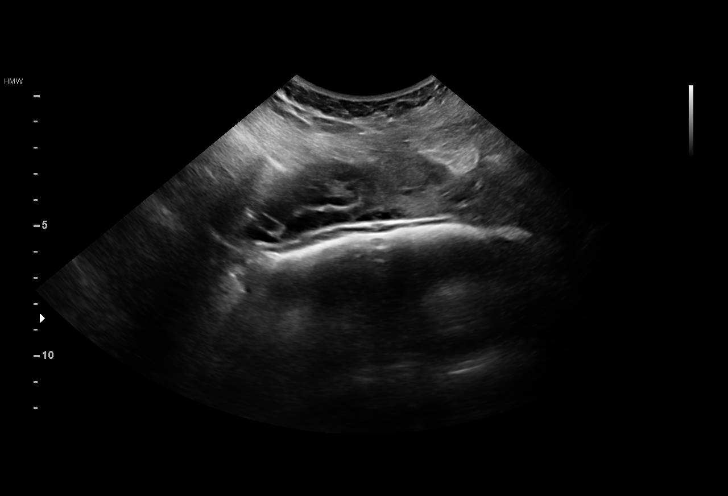
[im 18/32]
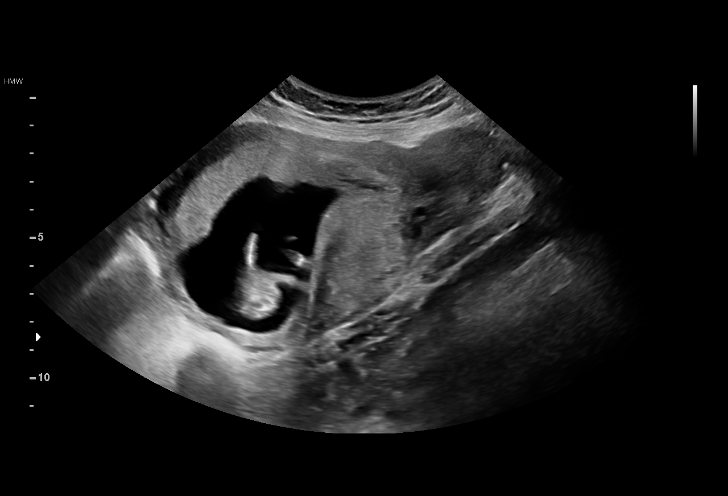
[im 20/32]
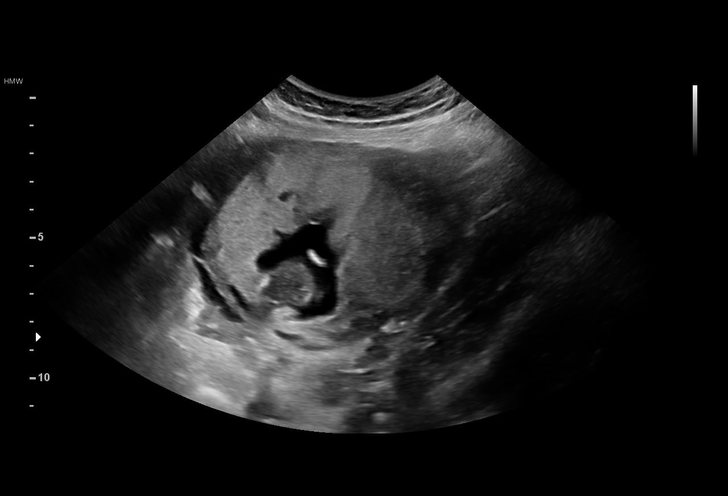
[im 22/32]
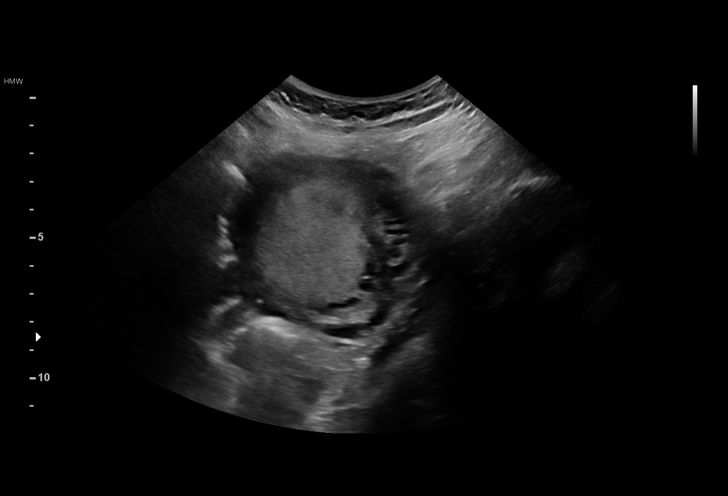
[im 25/32]
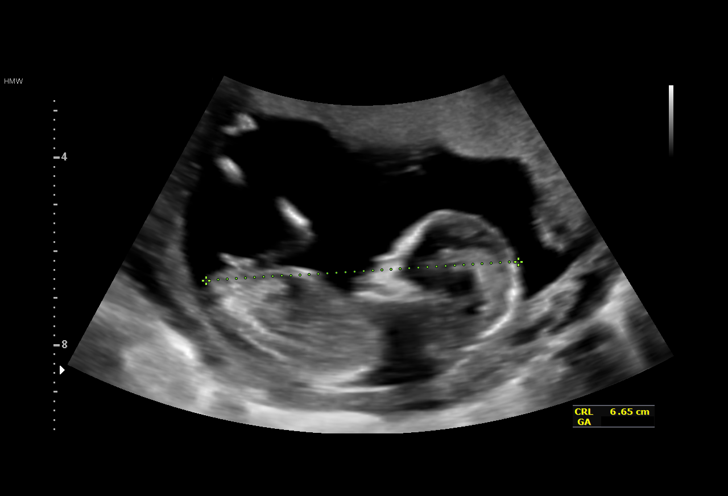
[im 27/32]
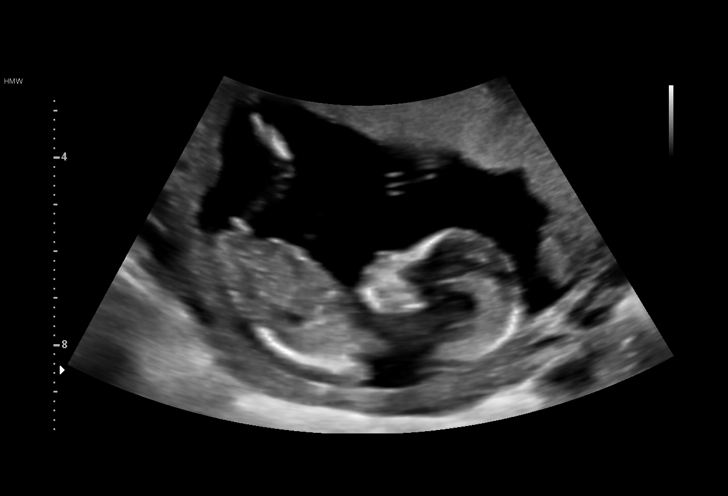
[im 29/32]
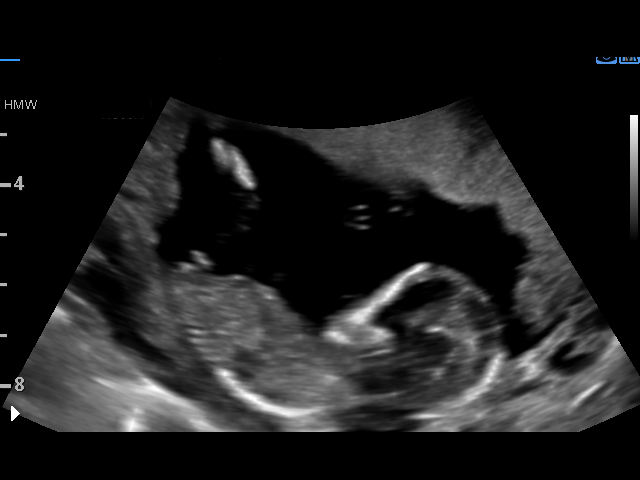
[im 32/32]
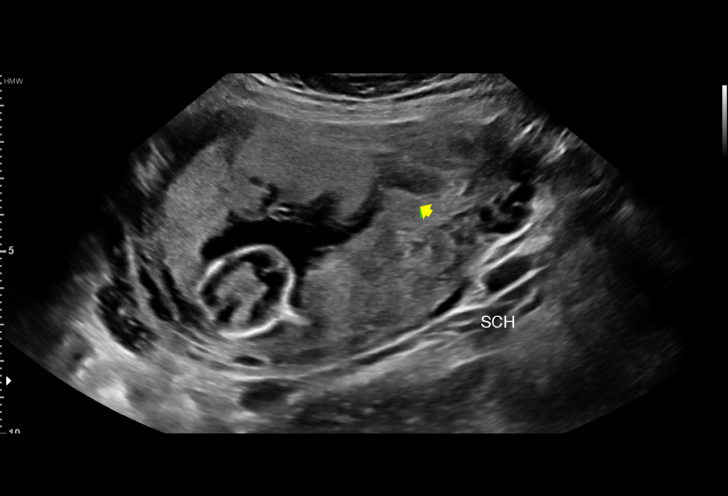

[15 of 28 positions shown; findings below may reference images not displayed]

FINDINGS: Intrauterine gestational sac: Present

Yolk sac:  None identified

Embryo:  Present

Cardiac Activity: Present

Heart Rate: 137 bpm

CRL:   66.0  mm   12 w 6 d                  US EDC: 05/15/2017

Subchorionic hemorrhage:  Small subchronic hemorrhage

Maternal uterus/adnexae:

Ovaries nonvisualized likely due to bowel and enlarged gravid
uterus.

Mid pelvis and RIGHT adnexa obscured by bowel.

No free pelvic fluid or pelvic mass identified.
IMPRESSION: Single live intrauterine gestation as above.

Small subchronic hemorrhage.

## 2016-11-06 MED ORDER — POLYETHYLENE GLYCOL 3350 17 G PO PACK: 17.0000 g | PACK | Freq: Every day | ORAL | 1 refills | Status: AC

## 2016-11-06 MED ORDER — GLYCERIN (ADULT) 2 G RE SUPP: 1.0000 | RECTAL | 0 refills | Status: AC | PRN

## 2016-11-06 NOTE — MAU Provider Note (Signed)
Chief Complaint: Vaginal Bleeding   SUBJECTIVE HPI: Maria Harvey is a 31 y.o. G5P1031 at [redacted]w[redacted]d who presents to [facility] Admissions reporting brownish/blood discharge.  Patient states around 2PM today, 2 hours ago, she went to urinate, she noticed spotting like discharge when she wiped. No blood in the toilet, and the spotting was brownish like discharge, no active bleeding or bright red blood. States definitely from the vagina not rectum. Following this episode she started having some lower pelvic cramping, not sharp but like a period cramp. Denies LOF, abnormal vag dicharge.  Has not had any bleeding during the pregnancy. Had an ultrasound in the office in January, but was not very far along at that point.   Has known colitis, therefore does not have normal BMs, about every 3-4 days, or needs to do an enema. Last BM was yesterday, normal and soft, no bleeding. Does not follow with GI doctor, never had colonoscopy. She saw a GI doctor once during last pregnancy, which was when she was diagnosed with colitis. Denies hematochezia or melena. Denies vomiting.  Past Medical History:  Diagnosis Date  . Anemia   . Colitis   . Constipation    severe. since childhood  . Medical history non-contributory    OB History  Gravida Para Term Preterm AB Living  SAB TAB Ectopic Multiple Live Births  0 1    # Outcome Date GA Lbr Len/2nd Weight Sex Delivery Anes PTL Lv  5 Current           4 Term 01/29/15 [redacted]w[redacted]d 07:25 / 00:24 6 lb 15.3 oz (3.155 kg) F Vag-Spont EPI  LIV  3 Ectopic           2 SAB           1 TAB              Past Surgical History:  Procedure Laterality Date  . DILATION AND CURETTAGE OF UTERUS    . NO PAST SURGERIES     Social History   Social History  . Marital status: Single    Spouse name: N/A  . Number of children: N/A  . Years of education: N/A   Occupational History  . Not on file.   Social History Main Topics  . Smoking status: Never Smoker   . Smokeless tobacco: Never Used  . Alcohol use No  . Drug use: No  . Sexual activity: Yes    Birth control/ protection: None   Other Topics Concern  . Not on file   Social History Narrative  . No narrative on file   No current facility-administered medications on file prior to encounter.    No current outpatient prescriptions on file prior to encounter.   No Known Allergies  I have reviewed the past Medical Hx, Surgical Hx, Social Hx, Allergies and Medications.   REVIEW OF SYSTEMS  A comprehensive ROS was negative except per HPI.    OBJECTIVE Patient Vitals for the past 24 hrs:  BP Temp Temp src Pulse Resp SpO2 Height Weight  11/06/16 1523 123/77 98.6 F (37 C) Oral 85 18 99 %  (1.803 m) 178 lb 4 oz (80.9 kg)    PHYSICAL EXAM Constitutional: Well-developed, well-nourished female in no acute distress.  Cardiovascular: normal rate Respiratory: normal rate and effort.  GI: Abd soft, non-tender, mildly distended due to stool in colon. Pos BS x 4 MS: Extremities nontender, no edema,  normal ROM Neurologic: Alert and oriented x 4.  GU: Neg CVAT. Rectum: Good rectal tone, light brown soft stool noted around anus without blood, no hemorrhoids. Stool in rectal vault, soft/mushy, no impaction noted, no blood noted.  SPECULUM EXAM: NEFG, physiologic milky white discharge, no blood noted LIMITED due to distended colon causing lateral displacement of cervix/uterus to entire left side of abdomen, unable to visualize cervix.  SVE: unable to reach cervix due to bowel/displacement   LAB RESULTS Results for orders placed or performed during the hospital encounter of 11/06/16 (from the past 24 hour(s))  Urinalysis, Routine w reflex microscopic     Status: Abnormal   Collection Time: 11/06/16  3:25 PM  Result Value Ref Range   Color, Urine YELLOW YELLOW   APPearance CLOUDY (A) CLEAR   Specific Gravity, Urine 1.025 1.005 - 1.030   pH 5.0 5.0 - 8.0   Glucose, UA NEGATIVE  NEGATIVE mg/dL   Hgb urine dipstick NEGATIVE NEGATIVE   Bilirubin Urine NEGATIVE NEGATIVE   Ketones, ur NEGATIVE NEGATIVE mg/dL   Protein, ur NEGATIVE NEGATIVE mg/dL   Nitrite POSITIVE (A) NEGATIVE   Leukocytes, UA MODERATE (A) NEGATIVE   RBC / HPF 0-5 0 - 5 RBC/hpf   WBC, UA 6-30 0 - 5 WBC/hpf   Bacteria, UA FEW (A) NONE SEEN   Squamous Epithelial / LPF TOO NUMEROUS TO COUNT (A) NONE SEEN   Mucous PRESENT   Pregnancy, urine POC     Status: Abnormal   Collection Time: 11/06/16  3:38 PM  Result Value Ref Range   Preg Test, Ur POSITIVE (A) NEGATIVE  Wet prep, genital     Status: Abnormal   Collection Time: 11/06/16  3:39 PM  Result Value Ref Range   Yeast Wet Prep HPF POC NONE SEEN NONE SEEN   Trich, Wet Prep NONE SEEN NONE SEEN   Clue Cells Wet Prep HPF POC PRESENT (A) NONE SEEN   WBC, Wet Prep HPF POC MODERATE (A) NONE SEEN   Sperm NONE SEEN   CBC     Status: Abnormal   Collection Time: 11/06/16  3:57 PM  Result Value Ref Range   WBC 11.5 (H) 4.0 - 10.5 K/uL   RBC 3.51 (L) 3.87 - 5.11 MIL/uL   Hemoglobin 11.1 (L) 12.0 - 15.0 g/dL   HCT 21.3 (L) 08.6 - 57.8 %   MCV 92.0 78.0 - 100.0 fL   MCH 31.6 26.0 - 34.0 pg   MCHC 34.4 30.0 - 36.0 g/dL   RDW 46.9 62.9 - 52.8 %   Platelets 299 150 - 400 K/uL  ABO/Rh     Status: None   Collection Time: 11/06/16  3:57 PM  Result Value Ref Range   ABO/RH(D) O POS     IMAGING US Ob Comp Less 14 Wks  Result Date: 11/06/2016 CLINICAL DATA:  Vaginal bleeding today, history of colitis, pregnant EXAM: OBSTETRIC <14 WK ULTRASOUND TECHNIQUE: Transabdominal ultrasound was performed for evaluation of the gestation as well as the maternal uterus and adnexal regions. COMPARISON:  None for this gestation FINDINGS: Intrauterine gestational sac: Present Yolk sac:  None identified Embryo:  Present Cardiac Activity: Present Heart Rate: 137 bpm CRL:   66.0  mm   12 w 6 d                  [place] EDC: 05/15/2017 Subchorionic hemorrhage:  Small subchronic  hemorrhage Maternal uterus/adnexae: Ovaries nonvisualized likely due to bowel and enlarged gravid uterus. Mid pelvis and  RIGHT adnexa obscured by bowel. No free pelvic fluid or pelvic mass identified. IMPRESSION: Single live intrauterine gestation as above. Small subchronic hemorrhage. Electronically Signed   By: Ulyses Southward M.D.   On: 11/06/2016 17:06    MAU COURSE Wet prep GC/CT Limited US Rectal exam Limited BSUS - 140s FHT, gross movement of baby noted, uterus noted to be on left side of abdomen  Discussed with Dr. Mora Appl the findings and plan, she agrees, OK for discharge.   MDM Plan of care reviewed with patient, including labs and tests ordered and medical treatment. Bowel regimen reviewed with patient (similar to GI recs in 2016), Miralax 3 capfuls in 1 quart gatorade today, then 1 capful daily, with glycerin suppositories prn if no BM. Follow up with Dr. Mora Appl in office next week, call office to make appointment. Recommend to follow up with GI, ESPECIALLY after pregnancy is concluded. Strict bleeding precautions given.    ASSESSMENT 1. Subchorionic hemorrhage of placenta in first trimester, single or unspecified fetus   2. Vaginal bleeding in pregnancy, second trimester   3. Unsure of LMP (last menstrual period) as reason for ultrasound scan   4. Vaginal bleeding in pregnancy, first trimester   5. Constipation due to outlet dysfunction     PLAN Discharge home in stable condition. Bowel regimen Bleeding precautions Follow up with OB in 1 week Needs GI appointment  Follow-up Information    [facility]. Schedule an appointment as soon as possible for a visit in 1 week(s).   Specialty:  Gastroenterology Why:  Follow up for "Colitis" Contact information: [address] [place] [address] [phone]       [facility]. Call in 1 week(s).   Why:  Call to make appointment to be seen by Dr. Mora Appl next week, mention to staff that Dr.  Wynelle Fanny when see in MAU [facility]).  Contact information: [address] [place] [address] [phone]          Allergies as of 11/06/2016   No Known Allergies     Medication List    TAKE these medications   glycerin adult 2 g suppository Place 1 suppository rectally as needed for constipation. If not able to have a bowel movement.   polyethylene glycol packet Commonly known as:  MIRALAX Take 17 g by mouth daily.   prenatal multivitamin Tabs tablet Take 1 tablet by mouth daily at 12 noon.        Jen Mow, DO OB Fellow 11/06/2016 5:39 PM

## 2016-11-06 NOTE — MAU Note (Signed)
Pt c/o vaginal spotting that started today. Pt states she saw some brown spotting today when she wiped. Pt states she is wearing a pad and didn't see blood on the pad. Pt states she has some cramping that started this morning.

## 2016-11-06 NOTE — Discharge Instructions (Signed)
* Miralax 3 capfuls in 1 quart of Gatorade later today.  * Then use 1 capful daily of Miralax.  * Use a glycerin suppository daily as needed.  * Add "P fruits" to diet (see peaches, pears, prunes, plums, pineapple).  * Drink AT LEAST 8 bottles of water daily.    Subchorionic Hematoma A subchorionic hematoma is a gathering of blood between the outer wall of the placenta and the inner wall of the womb (uterus). The placenta is the organ that connects the fetus to the wall of the uterus. The placenta performs the feeding, breathing (oxygen to the fetus), and waste removal (excretory work) of the fetus. Subchorionic hematoma is the most common abnormality found on a result from ultrasonography done during the first trimester or early second trimester of pregnancy. If there has been little or no vaginal bleeding, early small hematomas usually shrink on their own and do not affect your baby or pregnancy. The blood is gradually absorbed over 1-2 weeks. When bleeding starts later in pregnancy or the hematoma is larger or occurs in an older pregnant woman, the outcome may not be as good. Larger hematomas may get bigger, which increases the chances for miscarriage. Subchorionic hematoma also increases the risk of premature detachment of the placenta from the uterus, preterm (premature) labor, and stillbirth. Follow these instructions at home:  Stay on bed rest if your health care provider recommends this. Although bed rest will not prevent more bleeding or prevent a miscarriage, your health care provider may recommend bed rest until you are advised otherwise.  Avoid heavy lifting (more than 10 lb [4.5 kg]), exercise, sexual intercourse, or douching as directed by your health care provider.  Keep track of the number of pads you use each day and how soaked (saturated) they are. Write down this information.  Do not use tampons.  Keep all follow-up appointments as directed by your health care provider. Your  health care provider may ask you to have follow-up blood tests or ultrasound tests or both. Get help right away if:  You have severe cramps in your stomach, back, abdomen, or pelvis.  You have a fever.  You pass large clots or tissue. Save any tissue for your health care provider to look at.  Your bleeding increases or you become lightheaded, feel weak, or have fainting episodes. This information is not intended to replace advice given to you by your health care provider. Make sure you discuss any questions you have with your health care provider. Document Released: 11/11/2006 Document Revised: 01/02/2016 Document Reviewed: 02/23/2013 Elsevier Interactive Patient Education  2017 Elsevier Inc.    Vaginal Bleeding During Pregnancy, First Trimester A small amount of bleeding (spotting) from the vagina is relatively common in early pregnancy. It usually stops on its own. Various things may cause bleeding or spotting in early pregnancy. Some bleeding may be related to the pregnancy, and some may not. In most cases, the bleeding is normal and is not a problem. However, bleeding can also be a sign of something serious. Be sure to tell your health care provider about any vaginal bleeding right away. Some possible causes of vaginal bleeding during the first trimester include:  Infection or inflammation of the cervix.  Growths (polyps) on the cervix.  Miscarriage or threatened miscarriage.  Pregnancy tissue has developed outside of the uterus and in a fallopian tube (tubal pregnancy).  Tiny cysts have developed in the uterus instead of pregnancy tissue (molar pregnancy). Follow these instructions at home: Watch  your condition for any changes. The following actions may help to lessen any discomfort you are feeling:  Follow your health care provider's instructions for limiting your activity. If your health care provider orders bed rest, you may need to stay in bed and only get up to use the  bathroom. However, your health care provider may allow you to continue light activity.  If needed, make plans for someone to help with your regular activities and responsibilities while you are on bed rest.  Keep track of the number of pads you use each day, how often you change pads, and how soaked (saturated) they are. Write this down.  Do not use tampons. Do not douche.  Do not have sexual intercourse or orgasms until approved by your health care provider.  If you pass any tissue from your vagina, save the tissue so you can show it to your health care provider.  Only take over-the-counter or prescription medicines as directed by your health care provider.  Do not take aspirin because it can make you bleed.  Keep all follow-up appointments as directed by your health care provider. Contact a health care provider if:  You have any vaginal bleeding during any part of your pregnancy.  You have cramps or labor pains.  You have a fever, not controlled by medicine. Get help right away if:  You have severe cramps in your back or belly (abdomen).  You pass large clots or tissue from your vagina.  Your bleeding increases.  You feel light-headed or weak, or you have fainting episodes.  You have chills.  You are leaking fluid or have a gush of fluid from your vagina.  You pass out while having a bowel movement. This information is not intended to replace advice given to you by your health care provider. Make sure you discuss any questions you have with your health care provider. Document Released: 05/06/2005 Document Revised: 01/02/2016 Document Reviewed: 04/03/2013 Elsevier Interactive Patient Education  2017 [employer].

## 2016-11-09 LAB — GC/CHLAMYDIA PROBE AMP (~~LOC~~) NOT AT ARMC
CHLAMYDIA, DNA PROBE: NEGATIVE
Neisseria Gonorrhea: NEGATIVE

## 2017-08-07 ENCOUNTER — Encounter (HOSPITAL_COMMUNITY): Payer: Self-pay

## 2017-10-06 ENCOUNTER — Emergency Department (HOSPITAL_BASED_OUTPATIENT_CLINIC_OR_DEPARTMENT_OTHER)
Admission: EM | Admit: 2017-10-06 | Discharge: 2017-10-06 | Disposition: A | Discharge status: Home / Self Care | Payer: Medicaid Other | Attending: Emergency Medicine | Admitting: Emergency Medicine

## 2017-10-06 ENCOUNTER — Other Ambulatory Visit: Payer: Self-pay

## 2017-10-06 ENCOUNTER — Encounter (HOSPITAL_BASED_OUTPATIENT_CLINIC_OR_DEPARTMENT_OTHER): Payer: Self-pay | Admitting: Emergency Medicine

## 2017-10-06 DIAGNOSIS — N939 Abnormal uterine and vaginal bleeding, unspecified: Secondary | ICD-10-CM

## 2017-10-06 DIAGNOSIS — N72 Inflammatory disease of cervix uteri: Secondary | ICD-10-CM | POA: Insufficient documentation

## 2017-10-06 DIAGNOSIS — Z79899 Other long term (current) drug therapy: Secondary | ICD-10-CM | POA: Diagnosis not present

## 2017-10-06 DIAGNOSIS — N390 Urinary tract infection, site not specified: Secondary | ICD-10-CM | POA: Diagnosis not present

## 2017-10-06 LAB — URINALYSIS, ROUTINE W REFLEX MICROSCOPIC
Bilirubin Urine: NEGATIVE
Glucose, UA: NEGATIVE mg/dL
KETONES UR: NEGATIVE mg/dL
LEUKOCYTES UA: NEGATIVE
NITRITE: POSITIVE — AB
PH: 6 (ref 5.0–8.0)
Protein, ur: NEGATIVE mg/dL
Specific Gravity, Urine: >1.03 — ABNORMAL HIGH (ref 1.005–1.030)

## 2017-10-06 LAB — URINALYSIS, MICROSCOPIC (REFLEX)

## 2017-10-06 LAB — WET PREP, GENITAL
CLUE CELLS WET PREP: NONE SEEN
Sperm: NONE SEEN
TRICH WET PREP: NONE SEEN
YEAST WET PREP: NONE SEEN

## 2017-10-06 LAB — PREGNANCY, URINE: Preg Test, Ur: NEGATIVE

## 2017-10-06 MED ORDER — AZITHROMYCIN 250 MG PO TABS
1000.0000 mg | ORAL_TABLET | Freq: Once | ORAL | Status: AC
  Administered 2017-10-06: 1000 mg via ORAL
  Filled 2017-10-06: qty 4

## 2017-10-06 MED ORDER — NITROFURANTOIN MONOHYD MACRO 100 MG PO CAPS: 100.0000 mg | ORAL_CAPSULE | Freq: Two times a day (BID) | ORAL | 0 refills | Status: AC

## 2017-10-06 MED ORDER — CEFTRIAXONE SODIUM 250 MG IJ SOLR
250.0000 mg | Freq: Once | INTRAMUSCULAR | Status: AC
  Administered 2017-10-06: 250 mg via INTRAMUSCULAR
  Filled 2017-10-06: qty 250

## 2017-10-06 NOTE — ED Provider Notes (Signed)
[facility] EMERGENCY DEPARTMENT Provider Note   CSN: [id number] Arrival date & time: 10/06/17  1138     History   Chief Complaint Chief Complaint  Patient presents with  . Vaginal Bleeding    HPI Maria Harvey is a 32 y.o. female.  HPI Maria Harvey is a 32 y.o. female with history of prior colitis, anemia, presents to emergency department complaining of vaginal bleeding.  Patient states that she has noticed some spotting when she wiped that started today.  She states that is unusual for her so she decided to come to be checked out.  She states she had a Depo shot 1 month ago, states she normally either gets a full menstrual cycle or sometimes does not get any periods after she takes a Depo-Provera.  She states that she noticed some dark blood on the tissue when she wiped. She states when she wiped again, blood wasn't there. Denies any abdominal pain. No nausea, vomiting. No vaginal discharge otherwise. Last sexual intercourse 1 month ago, admits to no protection.   Past Medical History:  Diagnosis Date  . Anemia   . Colitis   . Constipation    severe. since childhood  . Medical history non-contributory     Patient Active Problem List   Diagnosis Date Noted  . Postpartum state 01/29/2015  . [redacted] weeks gestation of pregnancy   . Evaluate anatomy not seen on prior sonogram   . Pregnancy complication, antepartum   . Chronic constipation     Past Surgical History:  Procedure Laterality Date  . DILATION AND CURETTAGE OF UTERUS    . NO PAST SURGERIES      OB History    Gravida Para Term Preterm AB Living   5 1 1   3 1    SAB TAB Ectopic Multiple Live Births   1 1 1  0 1       Home Medications    Prior to Admission medications   Medication Sig Start Date End Date Taking? Authorizing Provider  glycerin adult 2 g suppository Place 1 suppository rectally as needed for constipation. If not able to have a bowel movement. 11/06/16   [place]um[place], Hiram ComberElizabeth  Woodland, DO  polyethylene glycol [facility](MIRALAX) packet Take 17 g by mouth daily. 11/06/16   [place], Hiram ComberElizabeth Woodland, DO  Prenatal Vit-Fe Fumarate-FA (PRENATAL MULTIVITAMIN) TABS tablet Take 1 tablet by mouth daily at 12 noon.    [provider]    Family History Family History  Problem Relation Age of Onset  . Diabetes Father   . Colon cancer Neg Hx   . Esophageal cancer Neg Hx   . Liver disease Neg Hx   . Kidney disease Neg Hx   . Heart disease Neg Hx     Social History Social History   Tobacco Use  . Smoking status: Never Smoker  . Smokeless tobacco: Never Used  Substance Use Topics  . Alcohol use: No    Alcohol/week: 0.0 oz  . Drug use: No     Allergies   Patient has no known allergies.   Review of Systems Review of Systems  Constitutional: Negative for chills and fever.  Respiratory: Negative for cough, chest tightness and shortness of breath.   Cardiovascular: Negative for chest pain, palpitations and leg swelling.  Gastrointestinal: Negative for abdominal pain, diarrhea, nausea and vomiting.  Genitourinary: Positive for vaginal bleeding. Negative for dysuria, flank pain, pelvic pain, vaginal discharge and vaginal pain.  Musculoskeletal: Negative for arthralgias, myalgias, neck  pain and neck stiffness.  Skin: Negative for rash.  Neurological: Negative for dizziness, weakness and headaches.  All other systems reviewed and are negative.    Physical Exam Updated Vital Signs BP 132/89 (BP Location: Right Arm)   Pulse 72   Temp 98.6 F (37 C) (Oral)   Resp 16   Ht 5\' 11"  (1.803 m)   Wt 91.6 kg (202 lb)   LMP 06/10/2017 (Approximate)   SpO2 98%   BMI 28.17 kg/m   Physical Exam  Constitutional: She appears well-developed and well-nourished. No distress.  Eyes: Conjunctivae are normal.  Neck: Neck supple.  Abdominal: There is no tenderness.  Genitourinary:  Genitourinary Comments: Normal external genitalia. Normal vaginal canal. Small thin white  discharge. Cervix is normal, closed. No CMT. No uterine or adnexal tenderness. No masses palpated.    Neurological: She is alert.  Skin: Skin is warm and dry.  Nursing note and vitals reviewed.    ED Treatments / Results  Labs (all labs ordered are listed, but only abnormal results are displayed) Labs Reviewed  WET PREP, GENITAL - Abnormal; Notable for the following components:      Result Value   WBC, Wet Prep HPF POC MANY (*)    All other components within normal limits  URINALYSIS, ROUTINE W REFLEX MICROSCOPIC - Abnormal; Notable for the following components:   APPearance CLOUDY (*)    Specific Gravity, Urine >1.030 (*)    Hgb urine dipstick MODERATE (*)    Nitrite POSITIVE (*)    All other components within normal limits  URINALYSIS, MICROSCOPIC (REFLEX) - Abnormal; Notable for the following components:   Bacteria, UA MANY (*)    Squamous Epithelial / LPF TOO NUMEROUS TO COUNT (*)    All other components within normal limits  PREGNANCY, URINE  GC/CHLAMYDIA PROBE AMP ([place]) NOT AT [facility]    EKG  EKG Interpretation None       Radiology No results found.  Procedures Procedures (including critical care time)  Medications Ordered in ED Medications - No data to display   Initial Impression / Assessment and Plan / ED Course  I have reviewed the triage vital signs and the nursing notes.  Pertinent labs & imaging results that were available during my care of the patient were reviewed by me and considered in my medical decision making (see chart for details).     Seen in emergency department with some spotting that started this morning, now resolved.  Pelvic exam is unremarkable.  There is no definite cervical motion tenderness or uterine tenderness on exam.  Patient's wet prep did show many white blood cells.  Gonorrhea and Chlamydia cultures pending.  I will go ahead and treat her with Rocephin and Zithromax in emergency department.  She also has a urinary tract  infection although the sample is contaminated, it is positive for nitrites and has many bacteria.  I will cover her with Macrobid.  Pregnancy test is negative.  Patient's vital signs are normal, she is in no acute distress otherwise.  She is stable for discharge home.  We will have her follow-up with her family doctor or return if worsening symptoms  Vitals:   10/06/17 1152  BP: 132/89  Pulse: 72  Resp: 16  Temp: 98.6 F (37 C)  TempSrc: Oral  SpO2: 98%  Weight: 91.6 kg (202 lb)  Height: 5\' 11"  (1.803 m)     Final Clinical Impressions(s) / ED Diagnoses   Final diagnoses:  Vaginal bleeding  Cervicitis  Urinary tract infection without hematuria, site unspecified    ED Discharge Orders        Ordered    nitrofurantoin, macrocrystal-monohydrate, (MACROBID) 100 MG capsule  2 times daily     10/06/17 1334       Jaynie Crumble, PA-C 10/06/17 1542    Doug Sou, MD 10/06/17 1642

## 2017-10-06 NOTE — ED Triage Notes (Signed)
Pt noticed some blood on the toilet paper this morning.  Had Depo 2 months ago.  Usually does not have periods on the shot.  Denies pain. Concerned due to the blood.

## 2017-10-06 NOTE — Discharge Instructions (Signed)
Take Macrobid as prescribed until all gone for urinary tract infection.  You were treated today for possible gonorrhea or chlamydia infection.  Your cultures are still pending and if come back abnormal we will contact you.  Avoid intercourse for at least a week after treatment.  Follow-up with family doctor return if worsening symptoms.

## 2017-10-07 LAB — GC/CHLAMYDIA PROBE AMP (~~LOC~~) NOT AT ARMC
Chlamydia: NEGATIVE
NEISSERIA GONORRHEA: NEGATIVE

## 2021-04-19 ENCOUNTER — Emergency Department (HOSPITAL_BASED_OUTPATIENT_CLINIC_OR_DEPARTMENT_OTHER)
Admission: EM | Admit: 2021-04-19 | Discharge: 2021-04-19 | Disposition: A | Discharge status: Home / Self Care | Payer: Medicaid Other | Attending: Emergency Medicine | Admitting: Emergency Medicine

## 2021-04-19 ENCOUNTER — Encounter (HOSPITAL_BASED_OUTPATIENT_CLINIC_OR_DEPARTMENT_OTHER): Payer: Self-pay | Admitting: *Deleted

## 2021-04-19 ENCOUNTER — Other Ambulatory Visit: Payer: Self-pay

## 2021-04-19 DIAGNOSIS — N751 Abscess of Bartholin's gland: Secondary | ICD-10-CM | POA: Diagnosis not present

## 2021-04-19 MED ORDER — DOXYCYCLINE HYCLATE 100 MG PO CAPS: 100.0000 mg | ORAL_CAPSULE | Freq: Two times a day (BID) | ORAL | 0 refills | Status: DC

## 2021-04-19 MED ORDER — LIDOCAINE HCL (PF) 1 % IJ SOLN
5.0000 mL | Freq: Once | INTRAMUSCULAR | Status: AC
  Administered 2021-04-19: 5 mL
  Filled 2021-04-19: qty 5

## 2021-04-19 MED ORDER — OXYCODONE-ACETAMINOPHEN 5-325 MG PO TABS
2.0000 | ORAL_TABLET | Freq: Once | ORAL | Status: AC
Start: 2021-04-19 — End: 2021-04-19
  Administered 2021-04-19: 2 via ORAL
  Filled 2021-04-19: qty 2

## 2021-04-19 MED ORDER — HYDROCODONE-ACETAMINOPHEN 5-325 MG PO TABS: 1.0000 | ORAL_TABLET | ORAL | 0 refills | Status: AC | PRN

## 2021-04-19 NOTE — ED Triage Notes (Signed)
Pt reports boil to labia today- swollen and painful, not draining

## 2021-04-19 NOTE — ED Provider Notes (Signed)
[facility] EMERGENCY DEPARTMENT Provider Note   CSN: [id number] Arrival date & time: 04/19/21  2015     History Chief Complaint  Patient presents with   Abscess    Maria Harvey is a 35 y.o. female.  The history is provided by the patient. No language interpreter was used.  Abscess Location:  Pelvis Pelvic abscess location:  Vulva Size:  3 Abscess quality: draining, redness and warmth   Red streaking: no   Duration:  3 days Progression:  Worsening Chronicity:  New Relieved by:  Nothing Worsened by:  Nothing Ineffective treatments:  None tried Associated symptoms: no nausea   Pt has had previous abscesses     Past Medical History:  Diagnosis Date   Anemia    Colitis    Constipation    severe. since childhood   Medical history non-contributory     Patient Active Problem List   Diagnosis Date Noted   Postpartum state 01/29/2015   [redacted] weeks gestation of pregnancy    Evaluate anatomy not seen on prior sonogram    Pregnancy complication, antepartum    Chronic constipation     Past Surgical History:  Procedure Laterality Date   DILATION AND CURETTAGE OF UTERUS     NO PAST SURGERIES       OB History     Gravida  5   Para  1   Term  1   Preterm      AB  3   Living  1      SAB  1   IAB  1   Ectopic  1   Multiple  0   Live Births  1           Family History  Problem Relation Age of Onset   Diabetes Father    Colon cancer Neg Hx    Esophageal cancer Neg Hx    Liver disease Neg Hx    Kidney disease Neg Hx    Heart disease Neg Hx     Social History   Tobacco Use   Smoking status: Never   Smokeless tobacco: Never  Vaping Use   Vaping Use: Every day  Substance Use Topics   Alcohol use: No    Alcohol/week: 0.0 standard drinks   Drug use: No    Home Medications Prior to Admission medications   Medication Sig Start Date End Date Taking? Authorizing Provider  doxycycline (VIBRAMYCIN) 100 MG capsule Take 1  capsule (100 mg total) by mouth 2 (two) times daily. 04/19/21  Yes Elson Areas, PA-C  HYDROcodone-acetaminophen (NORCO/VICODIN) 5-325 MG tablet Take 1 tablet by mouth every 4 (four) hours as needed for moderate pain. 04/19/21 04/19/22 Yes Cheron Schaumann K, PA-C  glycerin adult 2 g suppository Place 1 suppository rectally as needed for constipation. If not able to have a bowel movement. 11/06/16   Mumaw, Hiram Comber, DO  nitrofurantoin, macrocrystal-monohydrate, (MACROBID) 100 MG capsule Take 1 capsule (100 mg total) by mouth 2 (two) times daily. 10/06/17   Kirichenko, Tatyana, PA-C  polyethylene glycol (MIRALAX) packet Take 17 g by mouth daily. 11/06/16   Mumaw, Hiram Comber, DO  Prenatal Vit-Fe Fumarate-FA (PRENATAL MULTIVITAMIN) TABS tablet Take 1 tablet by mouth daily at 12 noon.    [provider]    Allergies    Patient has no known allergies.  Review of Systems   Review of Systems  Gastrointestinal:  Negative for nausea.  Genitourinary:  Positive for vaginal pain.  All  other systems reviewed and are negative.  Physical Exam Updated Vital Signs BP 129/90 (BP Location: Left Arm)   Pulse 88   Temp 99.1 F (37.3 C) (Oral)   Resp 18   Ht 5\' 11"  (1.803 m)   Wt 77.1 kg   LMP 04/05/2021   SpO2 100%   Breastfeeding No   BMI 23.71 kg/m   Physical Exam Vitals reviewed.  Constitutional:      Appearance: Normal appearance.  Genitourinary:    Comments: Swollen right labia  swollen  Musculoskeletal:        General: Normal range of motion.  Skin:    General: Skin is warm.  Neurological:     General: No focal deficit present.     Mental Status: She is alert.  Psychiatric:        Mood and Affect: Mood normal.    ED Results / Procedures / Treatments   Labs (all labs ordered are listed, but only abnormal results are displayed) Labs Reviewed - No data to display  EKG None  Radiology No results found.  Procedures .08/29/2022Incision and Drainage  Date/Time:  04/19/2021 11:19 PM Performed by: 06/19/2021, PA-C Authorized by: Elson Areas, PA-C   Consent:    Consent obtained:  Verbal   Consent given by:  Patient   Risks, benefits, and alternatives were discussed: yes   Universal protocol:    Procedure explained and questions answered to patient or proxy's satisfaction: yes     Patient identity confirmed:  Verbally with patient Location:    Type:  Bartholin cyst   Size:  2   Location:  Anogenital   Anogenital location:  Bartholin's gland Pre-procedure details:    Skin preparation:  Povidone-iodine Anesthesia:    Anesthesia method:  Local infiltration   Local anesthetic:  Lidocaine 1% w/o epi Procedure type:    Complexity:  Simple Procedure details:    Needle aspiration: no     Incision types:  Single straight   Drainage:  Purulent   Drainage amount:  Moderate   Wound treatment:  Wound left open Post-procedure details:    Procedure completion:  Tolerated with difficulty Comments:     I was unable to place word catheter,  I was able to irrigate abscess,    Medications Ordered in ED Medications  lidocaine (PF) (XYLOCAINE) 1 % injection 5 mL (has no administration in time range)  oxyCODONE-acetaminophen (PERCOCET/ROXICET) 5-325 MG per tablet 2 tablet (2 tablets Oral Given 04/19/21 2143)    ED Course  I have reviewed the triage vital signs and the nursing notes.  Pertinent labs & imaging results that were available during my care of the patient were reviewed by me and considered in my medical decision making (see chart for details).    MDM Rules/Calculators/A&P                           MDM:  Pt advised to see her Gyn.  Pt advised she may need further procedure  Final Clinical Impression(s) / ED Diagnoses Final diagnoses:  Bartholin's gland abscess    Rx / DC Orders ED Discharge Orders          Ordered    doxycycline (VIBRAMYCIN) 100 MG capsule  2 times daily        04/19/21 2311    HYDROcodone-acetaminophen  (NORCO/VICODIN) 5-325 MG tablet  Every 4 hours PRN        04/19/21 2311  An After Visit Summary was printed and given to the patient.    Elson Areas, Cordelia Poche 04/19/21 2322    Alvira Monday, MD 04/21/21 1301

## 2021-04-19 NOTE — Discharge Instructions (Signed)
Follow up with your MD for recheck 

## 2021-04-19 NOTE — ED Notes (Signed)
Medicated per order for pain.  Patient called a family member to verify they could give her a ride home while I was in the room.

## 2022-02-06 ENCOUNTER — Other Ambulatory Visit: Payer: Self-pay

## 2022-02-06 ENCOUNTER — Emergency Department (HOSPITAL_BASED_OUTPATIENT_CLINIC_OR_DEPARTMENT_OTHER)
Admission: EM | Admit: 2022-02-06 | Discharge: 2022-02-06 | Disposition: A | Discharge status: Home / Self Care | Payer: Medicaid Other | Attending: Emergency Medicine | Admitting: Emergency Medicine

## 2022-02-06 ENCOUNTER — Encounter (HOSPITAL_BASED_OUTPATIENT_CLINIC_OR_DEPARTMENT_OTHER): Payer: Self-pay | Admitting: *Deleted

## 2022-02-06 DIAGNOSIS — N751 Abscess of Bartholin's gland: Secondary | ICD-10-CM | POA: Diagnosis present

## 2022-02-06 MED ORDER — LIDOCAINE-EPINEPHRINE (PF) 2 %-1:200000 IJ SOLN
10.0000 mL | Freq: Once | INTRAMUSCULAR | Status: AC
  Administered 2022-02-06: 10 mL
  Filled 2022-02-06: qty 20

## 2022-02-06 MED ORDER — OXYCODONE-ACETAMINOPHEN 5-325 MG PO TABS
1.0000 | ORAL_TABLET | Freq: Once | ORAL | Status: AC
  Administered 2022-02-06: 1 via ORAL
  Filled 2022-02-06: qty 1

## 2022-02-06 MED ORDER — DOXYCYCLINE HYCLATE 100 MG PO CAPS: 100.0000 mg | ORAL_CAPSULE | Freq: Two times a day (BID) | ORAL | 0 refills | Status: DC

## 2022-02-06 NOTE — ED Triage Notes (Signed)
States has a boil that presented at approx 3 days, located near her vagina area

## 2022-02-06 NOTE — Discharge Instructions (Addendum)
You were seen in the emergency department for an abscess.  We have drained the area and cleaned it. I would like you to have the wound checked in 2-3 days. This can be done by any doctor's office, urgent care, or emergency department. This is to make sure the area hasn't closed too soon. Try to keep the area as clean and dry as possible. It is okay to let warm soapy water run over the area, but do NOT scrub the area.   I am placing on a course of antibiotics. It is important you finish the entire course! You can take ibuprofen or tylenol as needed for pain.   In the future, you can wash yourself with antiseptic/chlorhexidine soap to hopefully prevent recurrence of abscesses. Do not use this on your  Continue to monitor how you're doing and return to the ER for new or worsening symptoms.

## 2022-02-06 NOTE — ED Provider Notes (Signed)
[facility] EMERGENCY DEPARTMENT Provider Note   CSN: [id number] Arrival date & time: 02/06/22  1111     History  Chief Complaint  Patient presents with   Cyst    Maria Harvey is a 36 y.o. female who presents the emergency department with concern of a vaginal abscess.  Patient states that she has previously had Bartholin gland cyst/abscess that required drainage.  She normally gets 1 every few years.  States that this abscess is been there for about 3 days.  She tried to put a warm compress on it, but that made her pain significantly worse.  Feels this is in the same location as her previous.  Denies any fever.  HPI     Home Medications Prior to Admission medications   Medication Sig Start Date End Date Taking? Authorizing Provider  doxycycline (VIBRAMYCIN) 100 MG capsule Take 1 capsule (100 mg total) by mouth 2 (two) times daily. 02/06/22  Yes Johnetta Sloniker T, PA-C  polyethylene glycol (MIRALAX) packet Take 17 g by mouth daily. 11/06/16  Yes Mumaw, Hiram Comber, DO  glycerin adult 2 g suppository Place 1 suppository rectally as needed for constipation. If not able to have a bowel movement. 11/06/16   Mumaw, Hiram Comber, DO  HYDROcodone-acetaminophen (NORCO/VICODIN) 5-325 MG tablet Take 1 tablet by mouth every 4 (four) hours as needed for moderate pain. 04/19/21 04/19/22  Elson Areas, PA-C  nitrofurantoin, macrocrystal-monohydrate, (MACROBID) 100 MG capsule Take 1 capsule (100 mg total) by mouth 2 (two) times daily. 10/06/17   Kirichenko, Lemont Fillers, PA-C  Prenatal Vit-Fe Fumarate-FA (PRENATAL MULTIVITAMIN) TABS tablet Take 1 tablet by mouth daily at 12 noon.    [provider]      Allergies    Patient has no known allergies.    Review of Systems   Review of Systems  Skin:        Abscess  All other systems reviewed and are negative.   Physical Exam Updated Vital Signs BP 120/84   Pulse 62   Temp 98 F (36.7 C) (Oral)   Resp 18   Ht  5\' 10"  (1.778 m)   Wt 69.9 kg   SpO2 100%   BMI 22.10 kg/m  Physical Exam Vitals and nursing note reviewed. Exam conducted with a chaperone present.  Constitutional:      Appearance: Normal appearance.  HENT:     Head: Normocephalic and atraumatic.  Eyes:     Conjunctiva/sclera: Conjunctivae normal.  Pulmonary:     Effort: Pulmonary effort is normal. No respiratory distress.  Genitourinary:    Exam position: Lithotomy position.     Labia:        Right: Lesion present.     Skin:    General: Skin is warm and dry.  Neurological:     Mental Status: She is alert.  Psychiatric:        Mood and Affect: Mood normal.        Behavior: Behavior normal.     ED Results / Procedures / Treatments   Labs (all labs ordered are listed, but only abnormal results are displayed) Labs Reviewed - No data to display  EKG None  Radiology No results found.  Procedures . Incision and Drainage  Date/Time: 02/06/2022 1:04 PM  Performed by: 02/08/2022, PA-C Authorized by: Su Monks, PA-C   Consent:    Consent obtained:  Verbal   Consent given by:  Patient   Risks, benefits, and alternatives were discussed: yes  Risks discussed:  Bleeding, incomplete drainage and pain Universal protocol:    Procedure explained and questions answered to patient or proxy's satisfaction: yes     Patient identity confirmed:  Verbally with patient Location:    Type:  Bartholin cyst   Size:  3 cm   Location:  Anogenital   Anogenital location:  Bartholin's gland Pre-procedure details:    Skin preparation:  Povidone-iodine Sedation:    Sedation type:  None Anesthesia:    Anesthesia method:  Local infiltration   Local anesthetic:  Lidocaine 2% WITH epi Procedure details:    Needle aspiration: yes     Needle size:  25 G   Incision types:  Single straight   Incision depth:  Dermal   Wound management:  Probed and deloculated and irrigated with saline   Drainage:  Bloody and  purulent   Drainage amount:  Moderate   Wound treatment:  Wound left open   Packing materials:  None (Unable to place word catheter) Post-procedure details:    Procedure completion:  Tolerated well, no immediate complications     Medications Ordered in ED Medications  oxyCODONE-acetaminophen (PERCOCET/ROXICET) 5-325 MG per tablet 1 tablet (1 tablet Oral Given 02/06/22 1213)  lidocaine-EPINEPHrine (XYLOCAINE W/EPI) 2 %-1:200000 (PF) injection 10 mL (10 mLs Infiltration Given 02/06/22 1213)    ED Course/ Medical Decision Making/ A&P                           Medical Decision Making Risk Prescription drug management.   Patient is a 36 year old female who presents the emergency department with concern for a groin abscess.  On exam patient appears clinically well, afebrile not tachycardic.  She has an approximately 3 cm area of erythema, induration, and fluctuance in the right labia consistent with a Bartholin's gland abscess.  Abscess was incised and drained, with moderate purulent drainage.  Do not have Word catheter at our facility, but thoroughly cleaned and irrigated with saline.  After consideration of the diagnostic results and the patients response to treatment, I feel that emergency department workup does not suggest an emergent condition requiring admission or immediate intervention beyond what has been performed at this time. The plan is: Charged home with prescription for antibiotics and recommend wound check in 3 days. The patient is safe for discharge and has been instructed to return immediately for worsening symptoms, change in symptoms or any other concerns.  Final Clinical Impression(s) / ED Diagnoses Final diagnoses:  Bartholin's gland abscess    Rx / DC Orders ED Discharge Orders          Ordered    doxycycline (VIBRAMYCIN) 100 MG capsule  2 times daily        02/06/22 1346           Portions of this report may have been transcribed using voice recognition  software. Every effort was made to ensure accuracy; however, inadvertent computerized transcription errors may be present.    Su Monks, PA-C 02/06/22 1351    CuratoloMadelaine Bhat, DO 02/06/22 1414

## 2023-08-11 ENCOUNTER — Emergency Department (HOSPITAL_BASED_OUTPATIENT_CLINIC_OR_DEPARTMENT_OTHER)
Admission: EM | Admit: 2023-08-11 | Discharge: 2023-08-11 | Disposition: A | Discharge status: Home / Self Care | Payer: Self-pay | Attending: Emergency Medicine | Admitting: Emergency Medicine

## 2023-08-11 ENCOUNTER — Other Ambulatory Visit: Payer: Self-pay

## 2023-08-11 ENCOUNTER — Encounter (HOSPITAL_BASED_OUTPATIENT_CLINIC_OR_DEPARTMENT_OTHER): Payer: Self-pay

## 2023-08-11 DIAGNOSIS — L0291 Cutaneous abscess, unspecified: Secondary | ICD-10-CM

## 2023-08-11 DIAGNOSIS — L02416 Cutaneous abscess of left lower limb: Secondary | ICD-10-CM | POA: Insufficient documentation

## 2023-08-11 MED ORDER — DOXYCYCLINE HYCLATE 100 MG PO CAPS: 100.0000 mg | ORAL_CAPSULE | Freq: Two times a day (BID) | ORAL | 0 refills | Status: AC

## 2023-08-11 NOTE — ED Triage Notes (Signed)
 Abscess on her left shin area. States last night it got very large and draining.

## 2023-08-11 NOTE — ED Notes (Signed)
 Pt alert and oriented X 4 at the time of discharge. RR even and unlabored. No acute distress noted. Pt verbalized understanding of discharge instructions as discussed. Pt ambulatory to lobby at time of discharge.

## 2023-08-11 NOTE — ED Provider Notes (Signed)
  EMERGENCY DEPARTMENT AT [facility] Provider Note   CSN: [id number] Arrival date & time: 08/11/23  1119     History  Chief Complaint  Patient presents with   Abscess    Maria Harvey is a 38 y.o. female.  Patient is a 38 year old female who presents with an abscess on her left leg.  She states about a year ago she bumped it on a stair.  She had a little knot on at that time and the knots been there for the year.  However over the last few days its gotten more swollen and red and painful.  She has not had any prior issues with it.  She says that she does not think she would have gotten any foreign bodies into the area on the injury.  She said she just bumped it on the stair.  She denies any fevers.       Home Medications Prior to Admission medications   Medication Sig Start Date End Date Taking? Authorizing Provider  doxycycline  (VIBRAMYCIN ) 100 MG capsule Take 1 capsule (100 mg total) by mouth 2 (two) times daily. 08/11/23   Lenor Hollering, MD  glycerin  adult 2 g suppository Place 1 suppository rectally as needed for constipation. If not able to have a bowel movement. 11/06/16   Mumaw, Almarie Flaming, DO  nitrofurantoin , macrocrystal-monohydrate, (MACROBID ) 100 MG capsule Take 1 capsule (100 mg total) by mouth 2 (two) times daily. 10/06/17   Kirichenko, Tatyana, PA-C  polyethylene glycol (MIRALAX ) packet Take 17 g by mouth daily. 11/06/16   Mumaw, Almarie Flaming, DO  Prenatal Vit-Fe Fumarate-FA (PRENATAL MULTIVITAMIN) TABS tablet Take 1 tablet by mouth daily at 12 noon.    [provider]      Allergies    Patient has no known allergies.    Review of Systems   Review of Systems  Constitutional:  Negative for fever.  Gastrointestinal:  Negative for nausea and vomiting.  Skin:  Positive for wound.  Neurological:  Negative for headaches.    Physical Exam Updated Vital Signs BP 120/84 (BP Location: Left Arm)   Pulse 86   Temp 98.4 F  (36.9 C) (Oral)   Resp 14   SpO2 100%  Physical Exam Constitutional:      Appearance: She is well-developed.  HENT:     Head: Normocephalic and atraumatic.  Cardiovascular:     Rate and Rhythm: Normal rate.  Pulmonary:     Effort: Pulmonary effort is normal.  Musculoskeletal:        General: Tenderness present.     Cervical back: Normal range of motion and neck supple.     Comments: Patient has a 2 to 3 cm fluctuant area on the left pretibial area.  There are some mild warmth and erythema around the area.  It is open and draining.  Skin:    General: Skin is warm and dry.  Neurological:     Mental Status: She is alert and oriented to person, place, and time.     ED Results / Procedures / Treatments   Labs (all labs ordered are listed, but only abnormal results are displayed) Labs Reviewed - No data to display  EKG None  Radiology No results found.  Procedures Procedures    Medications Ordered in ED Medications - No data to display  ED Course/ Medical Decision Making/ A&P  Medical Decision Making Risk Prescription drug management.   Patient presents with an abscess to her left lower leg.  It is open and draining well.  I was able to express a large amount of purulent drainage.  It is somewhat cottage cheeselike consistency consistent with a prior cystic structure.  I discussed with her doing a formal I&D but she wants to hold off since it is draining currently.  I was able to almost completely express the apparent purulent material out.  Will start the patient on doxycycline .  Advised her to continue warm compresses.  Return precautions were given.  She will plan on following up with her primary care doctor for recheck.  Of note, her last menstrual period is now and she has not missed any cycles or had irregular periods.    Final Clinical Impression(s) / ED Diagnoses Final diagnoses:  Abscess    Rx / DC Orders ED Discharge Orders           Ordered    doxycycline  (VIBRAMYCIN ) 100 MG capsule  2 times daily        08/11/23 1215              Lenor Hollering, MD 08/11/23 1226
